# Patient Record
Sex: Male | Born: 1940 | Race: White | Hispanic: No | Marital: Married | Smoking: Never smoker
Health system: Southern US, Community
[De-identification: ages and names within clinical notes are randomized; demographics above are authoritative.]

## PROBLEM LIST (undated history)

## (undated) DIAGNOSIS — I255 Ischemic cardiomyopathy: Secondary | ICD-10-CM

## (undated) DIAGNOSIS — E785 Hyperlipidemia, unspecified: Secondary | ICD-10-CM

## (undated) DIAGNOSIS — I2510 Atherosclerotic heart disease of native coronary artery without angina pectoris: Secondary | ICD-10-CM

## (undated) DIAGNOSIS — J45909 Unspecified asthma, uncomplicated: Secondary | ICD-10-CM

## (undated) DIAGNOSIS — K869 Disease of pancreas, unspecified: Secondary | ICD-10-CM

## (undated) DIAGNOSIS — J449 Chronic obstructive pulmonary disease, unspecified: Secondary | ICD-10-CM

## (undated) DIAGNOSIS — C801 Malignant (primary) neoplasm, unspecified: Secondary | ICD-10-CM

## (undated) HISTORY — PX: OTHER SURGICAL HISTORY: SHX169

## (undated) HISTORY — DX: Ischemic cardiomyopathy: I25.5

## (undated) HISTORY — DX: Atherosclerotic heart disease of native coronary artery without angina pectoris: I25.10

## (undated) HISTORY — DX: Disease of pancreas, unspecified: K86.9

---

## 2000-11-21 ENCOUNTER — Encounter: Payer: Self-pay | Admitting: Emergency Medicine

## 2000-11-21 ENCOUNTER — Inpatient Hospital Stay (HOSPITAL_COMMUNITY): Admission: EM | Admit: 2000-11-21 | Discharge: 2000-11-23 | Payer: Self-pay | Admitting: Emergency Medicine

## 2000-11-27 ENCOUNTER — Inpatient Hospital Stay (HOSPITAL_COMMUNITY): Admission: EM | Admit: 2000-11-27 | Discharge: 2000-11-27 | Payer: Self-pay | Admitting: Emergency Medicine

## 2000-11-30 ENCOUNTER — Encounter: Admission: RE | Admit: 2000-11-30 | Discharge: 2000-11-30 | Payer: Self-pay | Admitting: Internal Medicine

## 2000-12-06 ENCOUNTER — Ambulatory Visit (HOSPITAL_COMMUNITY): Admission: RE | Admit: 2000-12-06 | Discharge: 2000-12-06 | Payer: Self-pay | Admitting: *Deleted

## 2001-01-12 ENCOUNTER — Encounter: Admission: RE | Admit: 2001-01-12 | Discharge: 2001-01-12 | Payer: Self-pay

## 2001-03-07 ENCOUNTER — Encounter: Admission: RE | Admit: 2001-03-07 | Discharge: 2001-03-07 | Payer: Self-pay | Admitting: Internal Medicine

## 2001-03-14 ENCOUNTER — Inpatient Hospital Stay (HOSPITAL_COMMUNITY): Admission: EM | Admit: 2001-03-14 | Discharge: 2001-03-16 | Payer: Self-pay | Admitting: Emergency Medicine

## 2001-03-19 ENCOUNTER — Ambulatory Visit (HOSPITAL_COMMUNITY): Admission: RE | Admit: 2001-03-19 | Discharge: 2001-03-19 | Payer: Self-pay | Admitting: Internal Medicine

## 2001-03-23 ENCOUNTER — Encounter: Admission: RE | Admit: 2001-03-23 | Discharge: 2001-03-23 | Payer: Self-pay | Admitting: Internal Medicine

## 2001-07-03 ENCOUNTER — Ambulatory Visit (HOSPITAL_COMMUNITY): Admission: RE | Admit: 2001-07-03 | Discharge: 2001-07-03 | Payer: Self-pay | Admitting: Pulmonary Disease

## 2001-07-03 ENCOUNTER — Encounter: Payer: Self-pay | Admitting: Pulmonary Disease

## 2001-12-03 ENCOUNTER — Encounter: Payer: Self-pay | Admitting: Emergency Medicine

## 2001-12-03 ENCOUNTER — Emergency Department (HOSPITAL_COMMUNITY): Admission: EM | Admit: 2001-12-03 | Discharge: 2001-12-03 | Payer: Self-pay | Admitting: Emergency Medicine

## 2002-01-29 ENCOUNTER — Ambulatory Visit (HOSPITAL_COMMUNITY): Admission: RE | Admit: 2002-01-29 | Discharge: 2002-01-29 | Payer: Self-pay | Admitting: Internal Medicine

## 2004-03-17 ENCOUNTER — Ambulatory Visit (HOSPITAL_COMMUNITY): Admission: RE | Admit: 2004-03-17 | Discharge: 2004-03-17 | Payer: Self-pay | Admitting: Dentistry

## 2015-11-02 ENCOUNTER — Other Ambulatory Visit: Payer: Self-pay | Admitting: Specialist

## 2015-11-02 DIAGNOSIS — M545 Low back pain: Secondary | ICD-10-CM

## 2015-11-09 ENCOUNTER — Ambulatory Visit
Admission: RE | Admit: 2015-11-09 | Discharge: 2015-11-09 | Disposition: A | Discharge status: Home / Self Care | Payer: Medicare Other | Source: Ambulatory Visit | Attending: Specialist | Admitting: Specialist

## 2015-11-09 DIAGNOSIS — M545 Low back pain: Secondary | ICD-10-CM

## 2015-12-03 ENCOUNTER — Other Ambulatory Visit: Payer: Self-pay | Admitting: Specialist

## 2015-12-03 DIAGNOSIS — M25511 Pain in right shoulder: Secondary | ICD-10-CM

## 2015-12-09 ENCOUNTER — Ambulatory Visit
Admission: RE | Admit: 2015-12-09 | Discharge: 2015-12-09 | Disposition: A | Discharge status: Home / Self Care | Payer: Medicare Other | Source: Ambulatory Visit | Attending: Specialist | Admitting: Specialist

## 2015-12-09 DIAGNOSIS — M25511 Pain in right shoulder: Secondary | ICD-10-CM

## 2016-03-07 ENCOUNTER — Emergency Department (HOSPITAL_COMMUNITY): Payer: Medicare Other

## 2016-03-07 ENCOUNTER — Encounter (HOSPITAL_COMMUNITY): Payer: Self-pay | Admitting: Emergency Medicine

## 2016-03-07 ENCOUNTER — Inpatient Hospital Stay (HOSPITAL_COMMUNITY)
Admission: EM | Admit: 2016-03-07 | Discharge: 2016-03-10 | DRG: 282 | Disposition: A | Discharge status: Home / Self Care | Payer: Medicare Other | Attending: Cardiovascular Disease | Admitting: Cardiovascular Disease

## 2016-03-07 ENCOUNTER — Encounter (HOSPITAL_COMMUNITY)
Admission: EM | Disposition: A | Discharge status: Home / Self Care | Payer: Self-pay | Source: Home / Self Care | Attending: Cardiovascular Disease

## 2016-03-07 DIAGNOSIS — K8689 Other specified diseases of pancreas: Secondary | ICD-10-CM

## 2016-03-07 DIAGNOSIS — K869 Disease of pancreas, unspecified: Secondary | ICD-10-CM | POA: Diagnosis present

## 2016-03-07 DIAGNOSIS — R739 Hyperglycemia, unspecified: Secondary | ICD-10-CM | POA: Diagnosis present

## 2016-03-07 DIAGNOSIS — R935 Abnormal findings on diagnostic imaging of other abdominal regions, including retroperitoneum: Secondary | ICD-10-CM | POA: Diagnosis present

## 2016-03-07 DIAGNOSIS — I2510 Atherosclerotic heart disease of native coronary artery without angina pectoris: Secondary | ICD-10-CM | POA: Diagnosis present

## 2016-03-07 DIAGNOSIS — E785 Hyperlipidemia, unspecified: Secondary | ICD-10-CM | POA: Diagnosis present

## 2016-03-07 DIAGNOSIS — J4520 Mild intermittent asthma, uncomplicated: Secondary | ICD-10-CM

## 2016-03-07 DIAGNOSIS — R079 Chest pain, unspecified: Secondary | ICD-10-CM

## 2016-03-07 DIAGNOSIS — I252 Old myocardial infarction: Secondary | ICD-10-CM | POA: Diagnosis not present

## 2016-03-07 DIAGNOSIS — R7989 Other specified abnormal findings of blood chemistry: Secondary | ICD-10-CM

## 2016-03-07 DIAGNOSIS — J45909 Unspecified asthma, uncomplicated: Secondary | ICD-10-CM | POA: Diagnosis present

## 2016-03-07 DIAGNOSIS — I25110 Atherosclerotic heart disease of native coronary artery with unstable angina pectoris: Secondary | ICD-10-CM | POA: Diagnosis not present

## 2016-03-07 DIAGNOSIS — J449 Chronic obstructive pulmonary disease, unspecified: Secondary | ICD-10-CM | POA: Diagnosis present

## 2016-03-07 DIAGNOSIS — I255 Ischemic cardiomyopathy: Secondary | ICD-10-CM | POA: Diagnosis present

## 2016-03-07 DIAGNOSIS — K862 Cyst of pancreas: Secondary | ICD-10-CM | POA: Diagnosis not present

## 2016-03-07 DIAGNOSIS — I214 Non-ST elevation (NSTEMI) myocardial infarction: Secondary | ICD-10-CM | POA: Diagnosis present

## 2016-03-07 DIAGNOSIS — K8020 Calculus of gallbladder without cholecystitis without obstruction: Secondary | ICD-10-CM

## 2016-03-07 DIAGNOSIS — R778 Other specified abnormalities of plasma proteins: Secondary | ICD-10-CM

## 2016-03-07 HISTORY — DX: Chronic obstructive pulmonary disease, unspecified: J44.9

## 2016-03-07 HISTORY — PX: CARDIAC CATHETERIZATION: SHX172

## 2016-03-07 HISTORY — DX: Unspecified asthma, uncomplicated: J45.909

## 2016-03-07 HISTORY — DX: Hyperlipidemia, unspecified: E78.5

## 2016-03-07 LAB — CBC WITH DIFFERENTIAL/PLATELET
Basophils Absolute: 0 10*3/uL (ref 0.0–0.1)
Basophils Relative: 0 %
Eosinophils Absolute: 0.3 10*3/uL (ref 0.0–0.7)
Eosinophils Relative: 4 %
HCT: 44.7 % (ref 39.0–52.0)
HEMOGLOBIN: 15.5 g/dL (ref 13.0–17.0)
LYMPHS ABS: 1.2 10*3/uL (ref 0.7–4.0)
LYMPHS PCT: 21 %
MCH: 29.6 pg (ref 26.0–34.0)
MCHC: 34.7 g/dL (ref 30.0–36.0)
MCV: 85.3 fL (ref 78.0–100.0)
Monocytes Absolute: 0.5 10*3/uL (ref 0.1–1.0)
Monocytes Relative: 9 %
NEUTROS PCT: 66 %
Neutro Abs: 3.9 10*3/uL (ref 1.7–7.7)
Platelets: 192 10*3/uL (ref 150–400)
RBC: 5.24 MIL/uL (ref 4.22–5.81)
RDW: 13.5 % (ref 11.5–15.5)
WBC: 5.9 10*3/uL (ref 4.0–10.5)

## 2016-03-07 LAB — ECHOCARDIOGRAM COMPLETE
Height: 70 in
Weight: 3552 oz

## 2016-03-07 LAB — COMPREHENSIVE METABOLIC PANEL
ALT: 14 U/L — AB (ref 17–63)
AST: 19 U/L (ref 15–41)
Albumin: 3.6 g/dL (ref 3.5–5.0)
Alkaline Phosphatase: 41 U/L (ref 38–126)
Anion gap: 8 (ref 5–15)
BUN: 12 mg/dL (ref 6–20)
CHLORIDE: 103 mmol/L (ref 101–111)
CO2: 27 mmol/L (ref 22–32)
CREATININE: 0.95 mg/dL (ref 0.61–1.24)
Calcium: 9.3 mg/dL (ref 8.9–10.3)
Glucose, Bld: 107 mg/dL — ABNORMAL HIGH (ref 65–99)
POTASSIUM: 4.2 mmol/L (ref 3.5–5.1)
SODIUM: 138 mmol/L (ref 135–145)
Total Bilirubin: 0.8 mg/dL (ref 0.3–1.2)
Total Protein: 6.8 g/dL (ref 6.5–8.1)

## 2016-03-07 LAB — URINALYSIS, ROUTINE W REFLEX MICROSCOPIC
BILIRUBIN URINE: NEGATIVE
Glucose, UA: NEGATIVE mg/dL
HGB URINE DIPSTICK: NEGATIVE
KETONES UR: NEGATIVE mg/dL
Leukocytes, UA: NEGATIVE
NITRITE: NEGATIVE
PROTEIN: NEGATIVE mg/dL
SPECIFIC GRAVITY, URINE: 1.019 (ref 1.005–1.030)
pH: 7 (ref 5.0–8.0)

## 2016-03-07 LAB — LIPASE, BLOOD: LIPASE: 39 U/L (ref 11–51)

## 2016-03-07 LAB — TSH: TSH: 1.988 u[IU]/mL (ref 0.350–4.500)

## 2016-03-07 LAB — I-STAT TROPONIN, ED: TROPONIN I, POC: 0.38 ng/mL — AB (ref 0.00–0.08)

## 2016-03-07 LAB — MRSA PCR SCREENING: MRSA by PCR: NEGATIVE

## 2016-03-07 LAB — TROPONIN I
TROPONIN I: 0.31 ng/mL — AB (ref <0.031)
TROPONIN I: 3.19 ng/mL — AB (ref <0.031)
Troponin I: 0.85 ng/mL (ref <0.031)
Troponin I: 4.46 ng/mL (ref <0.031)
Troponin I: 10.68 ng/mL (ref <0.031)

## 2016-03-07 LAB — CBG MONITORING, ED: Glucose-Capillary: 130 mg/dL — ABNORMAL HIGH (ref 65–99)

## 2016-03-07 LAB — D-DIMER, QUANTITATIVE (NOT AT ARMC): D DIMER QUANT: 0.47 ug{FEU}/mL (ref 0.00–0.50)

## 2016-03-07 LAB — PROTIME-INR
INR: 1.13 (ref 0.00–1.49)
Prothrombin Time: 14.7 seconds (ref 11.6–15.2)

## 2016-03-07 SURGERY — LEFT HEART CATH AND CORONARY ANGIOGRAPHY

## 2016-03-07 MED ORDER — ZOLPIDEM TARTRATE 5 MG PO TABS: 5.0000 mg | ORAL_TABLET | Freq: Every evening | ORAL | Status: DC | PRN

## 2016-03-07 MED ORDER — SODIUM CHLORIDE 0.9 % IV BOLUS (SEPSIS)
1000.0000 mL | Freq: Once | INTRAVENOUS | Status: AC
  Administered 2016-03-07: 1000 mL via INTRAVENOUS

## 2016-03-07 MED ORDER — SODIUM CHLORIDE 0.9% FLUSH: 3.0000 mL | INTRAVENOUS | Status: DC | PRN

## 2016-03-07 MED ORDER — SODIUM CHLORIDE 0.9 % WEIGHT BASED INFUSION: 1.0000 mL/kg/h | INTRAVENOUS | Status: AC

## 2016-03-07 MED ORDER — ACETAMINOPHEN 325 MG PO TABS
650.0000 mg | ORAL_TABLET | ORAL | Status: DC | PRN
  Administered 2016-03-07 – 2016-03-09 (×3): 650 mg via ORAL
  Filled 2016-03-07 (×3): qty 2

## 2016-03-07 MED ORDER — ISOSORBIDE MONONITRATE ER 30 MG PO TB24
15.0000 mg | ORAL_TABLET | Freq: Every day | ORAL | Status: DC
  Administered 2016-03-07 – 2016-03-10 (×4): 15 mg via ORAL
  Filled 2016-03-07 (×4): qty 1

## 2016-03-07 MED ORDER — SODIUM CHLORIDE 0.9 % WEIGHT BASED INFUSION: 1.0000 mL/kg/h | INTRAVENOUS | Status: DC

## 2016-03-07 MED ORDER — MORPHINE SULFATE (PF) 4 MG/ML IV SOLN
4.0000 mg | Freq: Once | INTRAVENOUS | Status: DC
  Filled 2016-03-07: qty 1

## 2016-03-07 MED ORDER — HEPARIN (PORCINE) IN NACL 2-0.9 UNIT/ML-% IJ SOLN
INTRAMUSCULAR | Status: AC
  Filled 2016-03-07: qty 1000

## 2016-03-07 MED ORDER — MOMETASONE FURO-FORMOTEROL FUM 200-5 MCG/ACT IN AERO
2.0000 | INHALATION_SPRAY | Freq: Two times a day (BID) | RESPIRATORY_TRACT | Status: DC
  Administered 2016-03-07 – 2016-03-09 (×5): 2 via RESPIRATORY_TRACT
  Filled 2016-03-07: qty 8.8

## 2016-03-07 MED ORDER — LIDOCAINE HCL (PF) 1 % IJ SOLN
INTRAMUSCULAR | Status: AC
  Filled 2016-03-07: qty 30

## 2016-03-07 MED ORDER — IOPAMIDOL (ISOVUE-370) INJECTION 76%
INTRAVENOUS | Status: DC | PRN
  Administered 2016-03-07: 75 mL via INTRA_ARTERIAL

## 2016-03-07 MED ORDER — ONDANSETRON HCL 4 MG/2ML IJ SOLN
4.0000 mg | Freq: Four times a day (QID) | INTRAMUSCULAR | Status: DC | PRN
  Administered 2016-03-07: 4 mg via INTRAVENOUS
  Filled 2016-03-07: qty 2

## 2016-03-07 MED ORDER — MIDAZOLAM HCL 2 MG/2ML IJ SOLN
INTRAMUSCULAR | Status: DC | PRN
  Administered 2016-03-07: 1 mg via INTRAVENOUS

## 2016-03-07 MED ORDER — LEVALBUTEROL HCL 0.63 MG/3ML IN NEBU: 0.6300 mg | INHALATION_SOLUTION | Freq: Four times a day (QID) | RESPIRATORY_TRACT | Status: DC | PRN

## 2016-03-07 MED ORDER — VERAPAMIL HCL 2.5 MG/ML IV SOLN
INTRAVENOUS | Status: AC
Start: 2016-03-07 — End: 2016-03-07
  Filled 2016-03-07: qty 2

## 2016-03-07 MED ORDER — HEPARIN SODIUM (PORCINE) 1000 UNIT/ML IJ SOLN
INTRAMUSCULAR | Status: DC | PRN
  Administered 2016-03-07: 5000 [IU] via INTRAVENOUS

## 2016-03-07 MED ORDER — VERAPAMIL HCL 2.5 MG/ML IV SOLN
INTRAVENOUS | Status: DC | PRN
  Administered 2016-03-07: 10 mL via INTRA_ARTERIAL

## 2016-03-07 MED ORDER — HEPARIN SODIUM (PORCINE) 1000 UNIT/ML IJ SOLN
INTRAMUSCULAR | Status: AC
  Filled 2016-03-07: qty 1

## 2016-03-07 MED ORDER — MIDAZOLAM HCL 2 MG/2ML IJ SOLN
INTRAMUSCULAR | Status: AC
  Filled 2016-03-07: qty 2

## 2016-03-07 MED ORDER — IPRATROPIUM-ALBUTEROL 0.5-2.5 (3) MG/3ML IN SOLN
3.0000 mL | Freq: Once | RESPIRATORY_TRACT | Status: AC
  Administered 2016-03-07: 3 mL via RESPIRATORY_TRACT
  Filled 2016-03-07: qty 3

## 2016-03-07 MED ORDER — TESTOSTERONE 10 MG/ACT (2%) TD GEL: 1.0000 "application " | Freq: Every evening | TRANSDERMAL | Status: DC

## 2016-03-07 MED ORDER — SODIUM CHLORIDE 0.9 % WEIGHT BASED INFUSION: 3.0000 mL/kg/h | INTRAVENOUS | Status: AC

## 2016-03-07 MED ORDER — FENTANYL CITRATE (PF) 100 MCG/2ML IJ SOLN
50.0000 ug | Freq: Once | INTRAMUSCULAR | Status: DC
  Filled 2016-03-07: qty 2

## 2016-03-07 MED ORDER — HEPARIN (PORCINE) IN NACL 100-0.45 UNIT/ML-% IJ SOLN
1250.0000 [IU]/h | INTRAMUSCULAR | Status: DC
  Administered 2016-03-07: 1250 [IU]/h via INTRAVENOUS
  Filled 2016-03-07: qty 250

## 2016-03-07 MED ORDER — SODIUM CHLORIDE 0.9 % IV SOLN: 250.0000 mL | INTRAVENOUS | Status: DC | PRN

## 2016-03-07 MED ORDER — SODIUM CHLORIDE 0.9% FLUSH
3.0000 mL | Freq: Two times a day (BID) | INTRAVENOUS | Status: DC
  Administered 2016-03-07 – 2016-03-10 (×5): 3 mL via INTRAVENOUS

## 2016-03-07 MED ORDER — HEPARIN BOLUS VIA INFUSION
4000.0000 [IU] | Freq: Once | INTRAVENOUS | Status: AC
  Administered 2016-03-07: 4000 [IU] via INTRAVENOUS
  Filled 2016-03-07: qty 4000

## 2016-03-07 MED ORDER — SODIUM CHLORIDE 0.9 % IV SOLN
INTRAVENOUS | Status: DC | PRN
  Administered 2016-03-07: 20 mL/h via INTRAVENOUS

## 2016-03-07 MED ORDER — LIDOCAINE HCL (PF) 1 % IJ SOLN
INTRAMUSCULAR | Status: DC | PRN
  Administered 2016-03-07: 2 mL

## 2016-03-07 MED ORDER — SODIUM CHLORIDE 0.9% FLUSH
3.0000 mL | Freq: Two times a day (BID) | INTRAVENOUS | Status: DC
  Administered 2016-03-08: 3 mL via INTRAVENOUS

## 2016-03-07 MED ORDER — HEPARIN (PORCINE) IN NACL 2-0.9 UNIT/ML-% IJ SOLN
INTRAMUSCULAR | Status: DC | PRN
  Administered 2016-03-07: 1000 mL

## 2016-03-07 MED ORDER — FENTANYL CITRATE (PF) 100 MCG/2ML IJ SOLN
INTRAMUSCULAR | Status: AC
  Filled 2016-03-07: qty 2

## 2016-03-07 MED ORDER — SODIUM CHLORIDE 0.9 % WEIGHT BASED INFUSION
1.0000 mL/kg/h | INTRAVENOUS | Status: DC
Start: 2016-03-08 — End: 2016-03-09

## 2016-03-07 MED ORDER — ENOXAPARIN SODIUM 40 MG/0.4ML ~~LOC~~ SOLN
40.0000 mg | SUBCUTANEOUS | Status: DC
  Administered 2016-03-08 – 2016-03-10 (×3): 40 mg via SUBCUTANEOUS
  Filled 2016-03-07 (×3): qty 0.4

## 2016-03-07 MED ORDER — PANTOPRAZOLE SODIUM 40 MG IV SOLR: 40.0000 mg | Freq: Once | INTRAVENOUS | Status: DC

## 2016-03-07 MED ORDER — ASPIRIN 81 MG PO CHEW
81.0000 mg | CHEWABLE_TABLET | ORAL | Status: DC
  Filled 2016-03-07: qty 1

## 2016-03-07 MED ORDER — ALPRAZOLAM 0.25 MG PO TABS: 0.2500 mg | ORAL_TABLET | Freq: Two times a day (BID) | ORAL | Status: DC | PRN

## 2016-03-07 MED ORDER — SODIUM CHLORIDE 0.9 % IV SOLN: INTRAVENOUS | Status: AC

## 2016-03-07 MED ORDER — ONDANSETRON HCL 4 MG/2ML IJ SOLN
4.0000 mg | Freq: Once | INTRAMUSCULAR | Status: AC
  Administered 2016-03-07: 4 mg via INTRAVENOUS
  Filled 2016-03-07: qty 2

## 2016-03-07 MED ORDER — NITROGLYCERIN 0.4 MG SL SUBL
0.4000 mg | SUBLINGUAL_TABLET | SUBLINGUAL | Status: DC | PRN
  Administered 2016-03-07: 0.4 mg via SUBLINGUAL
  Filled 2016-03-07: qty 1

## 2016-03-07 MED ORDER — ONDANSETRON HCL 4 MG/2ML IJ SOLN: 4.0000 mg | Freq: Once | INTRAMUSCULAR | Status: DC

## 2016-03-07 MED ORDER — ATORVASTATIN CALCIUM 80 MG PO TABS
80.0000 mg | ORAL_TABLET | Freq: Every day | ORAL | Status: DC
  Administered 2016-03-07 – 2016-03-09 (×3): 80 mg via ORAL
  Filled 2016-03-07 (×3): qty 1

## 2016-03-07 MED ORDER — GI COCKTAIL ~~LOC~~: 30.0000 mL | Freq: Once | ORAL | Status: DC

## 2016-03-07 MED ORDER — LORATADINE 10 MG PO TABS
10.0000 mg | ORAL_TABLET | Freq: Every day | ORAL | Status: DC
  Administered 2016-03-08 – 2016-03-10 (×3): 10 mg via ORAL
  Filled 2016-03-07 (×3): qty 1

## 2016-03-07 MED ORDER — FENTANYL CITRATE (PF) 100 MCG/2ML IJ SOLN
12.5000 ug | INTRAMUSCULAR | Status: DC | PRN
  Administered 2016-03-07: 25 ug via INTRAVENOUS
  Filled 2016-03-07: qty 2

## 2016-03-07 MED ORDER — FENTANYL CITRATE (PF) 100 MCG/2ML IJ SOLN
INTRAMUSCULAR | Status: DC | PRN
  Administered 2016-03-07: 25 ug via INTRAVENOUS

## 2016-03-07 MED ORDER — IOPAMIDOL (ISOVUE-370) INJECTION 76%
INTRAVENOUS | Status: AC
  Filled 2016-03-07: qty 100

## 2016-03-07 MED ORDER — METOPROLOL SUCCINATE ER 25 MG PO TB24
25.0000 mg | ORAL_TABLET | Freq: Every day | ORAL | Status: DC
  Administered 2016-03-07 – 2016-03-10 (×4): 25 mg via ORAL
  Filled 2016-03-07 (×4): qty 1

## 2016-03-07 SURGICAL SUPPLY — 11 items
CATH INFINITI 5 FR JL3.5 (CATHETERS) ×2 IMPLANT
CATH INFINITI 5FR ANG PIGTAIL (CATHETERS) ×2 IMPLANT
CATH INFINITI JR4 5F (CATHETERS) ×2 IMPLANT
DEVICE RAD COMP TR BAND LRG (VASCULAR PRODUCTS) ×2 IMPLANT
GLIDESHEATH SLEND SS 6F .021 (SHEATH) ×2 IMPLANT
KIT HEART LEFT (KITS) ×3 IMPLANT
PACK CARDIAC CATHETERIZATION (CUSTOM PROCEDURE TRAY) ×3 IMPLANT
SYR MEDRAD MARK V 150ML (SYRINGE) ×3 IMPLANT
TRANSDUCER W/STOPCOCK (MISCELLANEOUS) ×3 IMPLANT
TUBING CIL FLEX 10 FLL-RA (TUBING) ×3 IMPLANT
WIRE SAFE-T 1.5MM-J .035X260CM (WIRE) ×2 IMPLANT

## 2016-03-07 NOTE — Progress Notes (Signed)
  Echocardiogram 2D Echocardiogram has been performed.  Donata Clay 03/07/2016, 10:08 AM

## 2016-03-07 NOTE — H&P (Signed)
History and Physical   Patient ID: Frank Ayala MRN: [id number], DOB/AGE: [birth year] 75 y.o. Date of Encounter: 03/07/2016  Primary Physician: Dr Jackson Latino Primary Cardiologist: New, Dr Angelena Form  Chief Complaint:  Chest pain  HPI: Frank Ayala is a 75 y.o. male with a history of COPD, HLD, asthma Traveled to [place], returning recently   No recent SOB, no ischemic sx, activity level has been good. No SOB climbing stairs, no edema. Occasional wheezing, does not use inhaler. No bleeding issues.   Last pm, getting ready for bed, had onset of SSCP. No radiation, 7/10, associated SOB, no N&V, no diaphoresis. Not able to sleep. Thought was heartburn, GI rx no help.   When sx did not improve throughout the night, he came to the ER. In the ER, no ASA 2nd hx bronchospasm, SL NTG caused hypotension w/ SBP 70s and nausea, mild diaphoresis.  Now with 3-4/10 chest discomfort. Of note, abd [place] was abnl, results below.    Past Medical History  Diagnosis Date  . COPD (chronic obstructive pulmonary disease) ([place])   . Asthma   . Hyperlipidemia     Surgical History: History reviewed. No pertinent past surgical history.   I have reviewed the patient's current medications. Prior to Admission medications   Medication Sig Start Date End Date Taking? Authorizing Provider  albuterol (PROVENTIL HFA;VENTOLIN HFA) 108 (90 Base) MCG/ACT inhaler Inhale 1-2 puffs into the lungs every 6 (six) hours as needed for wheezing or shortness of breath.   Yes Historical Provider, MD  cetirizine (ZYRTEC) 10 MG tablet Take 10 mg by mouth daily as needed for allergies.   Yes Historical Provider, MD  Fluticasone-Salmeterol (ADVAIR) 250-50 MCG/DOSE AEPB Inhale 1 puff into the lungs 2 (two) times daily as needed (shortness of breath).   Yes Historical Provider, MD  loratadine (CLARITIN) 10 MG tablet Take 10 mg by mouth daily as needed for allergies.   Yes Historical Provider, MD  simvastatin (ZOCOR) 40 MG tablet Take  40 mg by mouth daily.   Yes Historical Provider, MD  Testosterone (FORTESTA) 10 MG/ACT (2%) GEL Place 1 application onto the skin every evening.   Yes Historical Provider, MD   Scheduled Meds: . ondansetron  4 mg Intravenous Once   Continuous Infusions: . heparin 1,250 Units/hr (03/07/16 0857)   PRN Meds:.fentaNYL (SUBLIMAZE) injection, nitroGLYCERIN  Allergies:  Allergies  Allergen Reactions  . Nsaids Shortness Of Breath    Social History   Social History  . Marital Status: Married    Spouse Name: N/A  . Number of Children: N/A  . Years of Education: N/A   Occupational History  . Retired Nature conservation officer and [employer]    Social History Main Topics  . Smoking status: Never Smoker   . Smokeless tobacco: Not on file  . Alcohol Use: No  . Drug Use: No  . Sexual Activity: Not on file   Other Topics Concern  . Not on file   Social History Narrative   Lives in [place] with wife    Family History  Problem Relation Age of Onset  . Cancer Father    Family Status  Relation Status Death Age  . Mother Deceased 86    no CAD  . Father Deceased 51    Cancer  . Sister Deceased 57    no CAD    Review of Systems:   Full 14-point review of systems otherwise negative except as noted above.  Physical Exam: Blood pressure 122/61, pulse  66, temperature 98.4 F (36.9 C), temperature source Oral, resp. rate 18, height 5\' 10"  (1.778 m), weight 222 lb (100.699 kg), SpO2 100 %. General: Well developed, well nourished,male in no acute distress. Head: Normocephalic, atraumatic, sclera non-icteric, no xanthomas, nares are without discharge. Dentition: good Neck: No carotid bruits. JVD not elevated. No thyromegally Lungs: Good expansion bilaterally. without rhonchi. Exp wheeze noted, few rales Heart: Regular rate and rhythm with S1 S2.  No S3 or S4.  No murmur, no rubs, or gallops appreciated. Abdomen: Soft, tender RUQ, non-distended with normoactive bowel sounds. No hepatomegaly. No rebound/guarding.  No obvious abdominal masses. Msk:  Strength and tone appear normal for age. No joint deformities or effusions, no spine or costo-vertebral angle tenderness. Extremities: No clubbing or cyanosis. No edema.  Distal pedal pulses are 2+ in 4 extrem Neuro: Alert and oriented X 3. Moves all extremities spontaneously. No focal deficits noted. Psych:  Responds to questions appropriately with a normal affect. Skin: No rashes or lesions noted  Labs:  Lab Results  Component Value Date   WBC 5.9 03/07/2016   HGB 15.5 03/07/2016   HCT 44.7 03/07/2016   MCV 85.3 03/07/2016   PLT 192 03/07/2016   No results for input(s): INR in the last 72 hours.   Recent Labs Lab 03/07/16 0624  NA 138  K 4.2  CL 103  CO2 27  BUN 12  CREATININE 0.95  CALCIUM 9.3  PROT 6.8  BILITOT 0.8  ALKPHOS 41  ALT 14*  AST 19  GLUCOSE 107*    Recent Labs  03/07/16 0637 03/07/16 0819  TROPONINI 0.31* 0.85*    Recent Labs  03/07/16 0633  TROPIPOC 0.38*   Lab Results  Component Value Date   DDIMER 0.47 03/07/2016    Radiology/Studies: Dg Chest 2 View 03/07/2016  CLINICAL DATA:  Right-sided chest pain and right upper quadrant abdominal pain since 11 p.m. last night. EXAM: CHEST  2 VIEW COMPARISON:  None. FINDINGS: The heart size and mediastinal contours are within normal limits. Linear atelectasis in the lung bases. No focal consolidation. Degenerative changes in the spine. IMPRESSION: Atelectasis in the lung bases.  No active consolidation. Electronically Signed   By: Lucienne Capers M.D.   On: 03/07/2016 06:47   US Abdomen Complete 03/07/2016  CLINICAL DATA:  Right upper quadrant pain for 1 day EXAM: ABDOMEN ULTRASOUND COMPLETE COMPARISON:  None. FINDINGS: Gallbladder: Within the gallbladder, there is an 8 mm echogenic focus which moves and shadows consistent with cholelithiasis. There is no demonstrable gallbladder wall thickening or pericholecystic fluid. No sonographic Murphy sign noted by sonographer.  Common bile duct: Diameter: 3 mm. There is no intrahepatic, common hepatic, or common bile duct dilatation. Liver: No focal lesion identified. Within normal limits in parenchymal echogenicity. IVC: No abnormality visualized. Pancreas: There is a hypoechoic lesion in the body of the pancreas measuring 1.9 x 1.1 x 2.4 cm. No other pancreatic lesion evident. Spleen: Size and appearance within normal limits. Right Kidney: Length: 11.1 cm. Echogenicity within normal limits. No mass or hydronephrosis visualized. Left Kidney: Length: 12.4 cm. Echogenicity within normal limits. No mass or hydronephrosis visualized. Abdominal aorta: No aneurysm visualized. Other findings: No demonstrable ascites. IMPRESSION: Cholelithiasis. No gallbladder wall thickening or pericholecystic fluid. Hypoechoic lesion in the body of the pancreas measuring 1.9 x 1.1 x 2.4 cm. This finding warrants either pre and post-contrast MR or CT of the pancreas to further evaluate. Study otherwise unremarkable. Electronically Signed   By: Lowella Grip III  M.D.   On: 03/07/2016 07:14   Echo: ordered  ECG: SR  ASSESSMENT AND PLAN:  Principal Problem:   NSTEMI (non-ST elevated myocardial infarction) ([place]) - admit, continue to cycle enzymes - ECG does not meet STEMI criteria - on heparin, no ASA 2nd bronchospasm - at this time, BP and HR too low for BB - admit stepdown - cath in am, sooner PRN - abdominal MRI prior to cath, would need to do BMS if abd mass likely to need surgery   Active Problems:   COPD (chronic obstructive pulmonary disease) ([facility]) - mild wheeze now - continue home rx, add Xopenex nebs    Asthma - see above    Hyperlipidemia - add high-dose statin - ck profile, LFTs are OK    Abnormal abdominal US - spoke w/ radiologist - MRI w/ and w/out is best, ordered - if abnl, further workup can likely be done as OP    Hyperglycemia - ck A1c  Signed, Lenoard Aden 03/07/2016 9:17 AM Beeper  [phone]    I have personally seen and examined this patient with Rosaria Ferries, PA-C. I agree with the assessment and plan as outlined above. He is admitted with chest pain, troponin is mildly elevated. His chest pain is mostly resolved. He did not respond well to NTG given in the ED. There was hypotension and nausea. Currently BP is stable. EKG with non-specific ST and T wave abnormalities but no injury current. Will admit to [facility] stepdown. Will continue IV heparin. Echo today. Will cycle troponin. He also has gallstones and pancreatic mass. The ED staff has ordered a MRI of the abdomen to better assess. Will likely need cardiac cath but would like to hold off until after we have a better idea of his pancreatic mass if possible. Will place him on the cath schedule for tomorrow. If his clinical status changes with recurrent chest pain or ischemic EKG changes, would have to plan cardiac cath today.   [name] 03/07/2016 11:18 AM

## 2016-03-07 NOTE — Progress Notes (Signed)
[place] for heparin Indication: chest pain/ACS  Allergies  Allergen Reactions  . Nsaids Shortness Of Breath    Patient Measurements: Height: 5\' 10"  (177.8 cm) Weight: 222 lb (100.699 kg) IBW/kg (Calculated) : 73 Heparin Dosing Weight: 94.1kg  Vital Signs: Temp: 98.4 F (36.9 C) (05/15 0539) Temp Source: Oral (05/15 0539) BP: 132/78 mmHg (05/15 0719) Pulse Rate: 61 (05/15 0719)  Labs:  Recent Labs  03/07/16 0624 03/07/16 0637  HGB 15.5  --   HCT 44.7  --   PLT 192  --   CREATININE 0.95  --   TROPONINI  --  0.31*    Estimated Creatinine Clearance: 81.1 mL/min (by C-G formula based on Cr of 0.95).   Medical History: Past Medical History  Diagnosis Date  . COPD (chronic obstructive pulmonary disease) ([place])   . Asthma   . Hyperlipidemia     Assessment: 74 yom w/ CP. Pharmacy consulted to dose heparin for ACS. No AC noted pta. CBC wnl, no bleed documented.  Goal of Therapy:  Heparin level 0.3-0.7 units/ml Monitor platelets by anticoagulation protocol: Yes   Plan:  Heparin 4000 unit bolus Start heparin at 1250 units/h 8h HL Daily HL/CBC Mon s/sx bleeding    Elicia Lamp, PharmD, [facility] Clinical Pharmacist Pager [phone] 03/07/2016 8:31 AM

## 2016-03-07 NOTE — ED Notes (Signed)
Cardiology at bedside.

## 2016-03-07 NOTE — ED Notes (Signed)
Pt arrives by POV with c/o right chest pain that started last night. Describes as a deep, sharp pain with some pressure. Pt traveled to [place] recently, but denies n/v/d or flu symptoms.

## 2016-03-07 NOTE — H&P (View-Only) (Signed)
Pt has had recurrence of chest pressure. Troponin is rising, now up to 3. I think urgent cardiac cath is indicated. I have reviewed this with the patient and his wife. I have added him onto the cath schedule for next case. Will hold off on MRI until tomorrow to assess the pancreatic mass seen on u/s this am. His NSTEMI will needed to be addressed urgently. Consider bare metal stent if needed given the possibility of a pancreatic mass.   [name] 03/07/2016 2:31 PM

## 2016-03-07 NOTE — ED Provider Notes (Addendum)
TIME SEEN: 6:00 AM  CHIEF COMPLAINT: Right-sided chest pain, upper abdominal pain  HPI: Pt is a 75 y.o. male with history of hyperlipidemia, asthma who presents to the emergency department with right-sided chest pain that started at 11:30 PM last night. Describes it as a constant pain that has not resolved at all. It is a sharp, pressure-like pain. He states it helps to put pressure on it with his hands. No aggravating factors. It is not exertional, pleuritic or related to food. States he did not come in earlier because he thought this could be heartburn and tried Tums without relief. Denies shortness of breath, nausea vomiting, diaphoresis.  States he did have some dizziness with standing up quickly. Has never had similar symptoms. No history of PE or DVT. No lower extremity swelling or pain. No fever or cough. Has had some intermittent wheezing which he reports is chronic for him. States he is due for his morning albuterol treatment. He has never been a smoker. No history of stress test or cardiac catheterization.  No history of NSAID use. Rarely drinks alcohol. No history of abdominal surgery.  PCP - [facility]  ROS: See HPI Constitutional: no fever  Eyes: no drainage  ENT: no runny nose   Cardiovascular:   chest pain  Resp: SOB  GI: no vomiting GU: no dysuria Integumentary: no rash  Allergy: no hives  Musculoskeletal: no leg swelling  Neurological: no slurred speech ROS otherwise negative  PAST MEDICAL HISTORY/PAST SURGICAL HISTORY:  No past medical history on file.  MEDICATIONS:  Prior to Admission medications   Not on File    ALLERGIES:  Allergies not on file  SOCIAL HISTORY:  Social History  Substance Use Topics  . Smoking status: Not on file  . Smokeless tobacco: Not on file  . Alcohol Use: Not on file    FAMILY HISTORY: No family history on file.  EXAM: BP 143/77 mmHg  Pulse 65  Temp(Src) 98.4 F (36.9 C) (Oral)  Resp 17  Ht 5\' 10"  (1.778 m)  Wt 222 lb  (100.699 kg)  BMI 31.85 kg/m2  SpO2 95% CONSTITUTIONAL: Alert and oriented and responds appropriately to questions. Well-appearing; well-nourished HEAD: Normocephalic EYES: Conjunctivae clear, PERRL ENT: normal nose; no rhinorrhea; moist mucous membranes NECK: Supple, no meningismus, no LAD  CARD: RRR; S1 and S2 appreciated; no murmurs, no clicks, no rubs, no gallops CHEST:  No tenderness palpation over the right chest wall but patient does report that pressure to this area helps relieve his pain, no crepitus, ecchymosis or deformity, no flail chest RESP: Normal chest excursion without splinting or tachypnea; breath sounds clear and equal bilaterally; mild scattered expiratory wheezes bilaterally, no rhonchi, no rales, no hypoxia or respiratory distress, speaking full sentences ABD/GI: Normal bowel sounds; non-distended; soft, tender to palpation in the right upper quadrant epigastric region, negative Murphy sign, no tenderness at McBurney's point, no rebound, no guarding, no peritoneal signs BACK:  The back appears normal and is non-tender to palpation, there is no CVA tenderness EXT: Normal ROM in all joints; non-tender to palpation; no edema; normal capillary refill; no cyanosis, no calf tenderness or swelling    SKIN: Normal color for age and race; warm; no rash NEURO: Moves all extremities equally, sensation to light touch intact diffusely, cranial nerves II through XII intact PSYCH: The patient's mood and manner are appropriate. Grooming and personal hygiene are appropriate.  MEDICAL DECISION MAKING: 6:00 AM  Patient here with very atypical chest pain. It is right-sided without  other associated symptoms. EKG shows small amount of elevation in one anteriorly than a small amount of ST depression in the lateral leads but is similar compared to prior and does not meet STEMI criteria.  Because of this abnormality, we'll repeat his EKG. He is tender to palpation in the right upper quadrant  epigastric region. Discussed with patient that this could be musculoskeletal in nature, cholelithiasis, cholecystitis, pancreatitis, gastritis, GERD. Will obtain cardiac labs as well as abdominal labs. Will obtain a right upper quad ultrasound, chest x-ray. He does have some wheezing on exam. Will give DuoNeb treatment.    ED PROGRESS: 6:30 AM  Patient taken over to ultrasound suite. i-stat troponin has come back positive at 0.38. Nurse to go over to ultrasound suite to obtain second EKG.  Will add on regular troponin.    6:50 AM  Pt's repeat EKG shows minimal ST elevation in V3 which is isolated and does not meet STEMI criteria. No reciprocal changes. Patient is still hemodynamically stable.   7:20 AM  Pt's repeat troponin is still elevated at 0.31 in the setting of normal kidney function. D-dimer is negative. LFTs, lipase normal. Chest x-ray clear. Ultrasound shows cholelithiasis without gallbladder wall thickening or pericholecystic fluid. No signs of acute cholecystitis. There is also a hypoechoic lesion in the body of the pancreas measuring 1.9 x 1.1 x 2.4 cm. Patient will need further imaging for further evaluation of this mass. I do not feel this needs to be done emergently. We'll discuss with cardiology on call given he does have a positive troponin. Patient refuses aspirins as he states that NSAIDs make him have asthma exacerbations.   8:00 AM  D/w cardiology consult service, Trish. They will send cardiology team to consult patient. Discussed with on coming EDP as well. Patient will need admission. He is comfortable with this plan.   EKG Interpretation  Date/Time:  Monday Mar 07 2016 05:37:34 EDT Ventricular Rate:  65 PR Interval:  173 QRS Duration: 98 QT Interval:  402 QTC Calculation: 418 R Axis:   50 Text Interpretation:  Sinus rhythm Atrial premature complex Minimal ST depression, lateral leads ST elevation, consider anterior injury No significant change since last tracing Confirmed  by WARD,  DO, KRISTEN [phone]) on 03/07/2016 5:43:47 AM       EKG Interpretation  Date/Time:  Monday Mar 07 2016 06:49:44 EDT Ventricular Rate:  69 PR Interval:  168 QRS Duration: 92 QT Interval:  410 QTC Calculation: 439 R Axis:   58 Text Interpretation:  Normal sinus rhythm with sinus arrhythmia Normal ECG No significant change since last tracing Confirmed by WARD,  DO, KRISTEN [id number]) on 03/07/2016 6:57:45 AM        Dante, DO 03/07/16 0758    8:05 AM  Pt Received one nitroglycerin tablet for his chest pain. He refused IV narcotics. Patient became diaphoretic, pale, gray appearing. He did report some improvement in his right-sided chest pain that he still describes as a dull ache. Patient was hypotensive with systolic blood pressure in the 70s, heart rate in the 40s. Given IV fluids, Zofran. Repeat EKG shows change in the morphology and ST segments in the anterior leads concerning for ischemia, ST depression in lateral leads. Given his positive troponin and EKG changes, will discuss with interventional last on call. Patient again reports he cannot take aspirin because of his history of NSAID induced bronchospasm.  Concern for worsening ischemia versus adverse reaction from nitroglycerin.  Will start heparin.   8:15  AM  Pt's HR in upper 50s and BP 107/69.  His color has improved And he reports feeling better. Blood glucose is 130.  Dr. [place] on call for cards in an emergent procedure.  Will have another cardiologist call back and review EKG.     8:20 AM  Dr. Julianne Handler and Suanne Marker PA with cardiology both at bedside. They have reviewed patient's EKGs and agree that this does not meet STEMI criteria. They agree with starting heparin and will admit for serial troponins and likely perform cardiac catheterization non-emergently. Patient still describes his pain as a dull ache which has not gotten worse or better during his stay in the emergency department. Cardiology would like to  further characterize his pancreatic mass. We'll discuss with radiology further recommendations on type of imaging. Patient and wife are comfortable with this plan.   8:45 AM  D/w Dr. Malachy Moan With radiology. They recommended MRI of patient's abdomen pancreatic protocol but states that this can be done as an outpatient test. Discussed this with cardiologist who will discuss with radiologist for appropriate test to be ordered.   EKG Interpretation  Date/Time:  Monday Mar 07 2016 08:02:12 EDT Ventricular Rate:  46 PR Interval:  156 QRS Duration: 91 QT Interval:  445 QTC Calculation: 389 R Axis:   24 Text Interpretation:  Sinus bradycardia Left atrial enlargement Low voltage, extremity and precordial leads Anteroseptal infarct, old ST depr, consider ischemia, anterolateral lds Tall T, consider metabolic/ischemic abnrm Baseline wander in lead(s) V2 V3 V4 V5 V6 Partial missing lead(s): V2 V3 V4 V5 V6 Confirmed by WARD,  DO, KRISTEN [id number]) on 03/07/2016 8:22:58 AM          CRITICAL CARE Performed by: Nyra Jabs   Total critical care time: 60 minutes  Critical care time was exclusive of separately billable procedures and treating other patients.  Critical care was necessary to treat or prevent imminent or life-threatening deterioration.  Critical care was time spent personally by me on the following activities: development of treatment plan with patient and/or surrogate as well as nursing, discussions with consultants, evaluation of patient's response to treatment, examination of patient, obtaining history from patient or surrogate, ordering and performing treatments and interventions, ordering and review of laboratory studies, ordering and review of radiographic studies, pulse oximetry and re-evaluation of patient's condition.   [place], DO 03/07/16 [phone]

## 2016-03-07 NOTE — Progress Notes (Signed)
Pt has had recurrence of chest pressure. Troponin is rising, now up to 3. I think urgent cardiac cath is indicated. I have reviewed this with the patient and his wife. I have added him onto the cath schedule for next case. Will hold off on MRI until tomorrow to assess the pancreatic mass seen on u/s this am. His NSTEMI will needed to be addressed urgently. Consider bare metal stent if needed given the possibility of a pancreatic mass.   [name] 03/07/2016 2:31 PM

## 2016-03-07 NOTE — Interval H&P Note (Signed)
History and Physical Interval Note:  03/07/2016 3:29 PM  Frank Ayala  has presented today for surgery, with the diagnosis of elevated trop  The various methods of treatment have been discussed with the patient and family. After consideration of risks, benefits and other options for treatment, the patient has consented to  Procedure(s): Left Heart Cath and Coronary Angiography (N/A) as a surgical intervention .  The patient's history has been reviewed, patient examined, no change in status, stable for surgery.  I have reviewed the patient's chart and labs.  Questions were answered to the patient's satisfaction.   Cath Lab Visit (complete for each Cath Lab visit)  Clinical Evaluation Leading to the Procedure:   ACS: Yes.    Non-ACS:    Anginal Classification: CCS IV  Anti-ischemic medical therapy: No Therapy  Non-Invasive Test Results: No non-invasive testing performed  Prior CABG: No previous CABG        [name] [facility] 03/07/2016 3:29 PM

## 2016-03-07 NOTE — ED Notes (Signed)
Attempted report 

## 2016-03-07 NOTE — ED Notes (Signed)
ECHO at bedside.

## 2016-03-08 ENCOUNTER — Encounter (HOSPITAL_COMMUNITY): Payer: Self-pay | Admitting: Cardiology

## 2016-03-08 ENCOUNTER — Inpatient Hospital Stay (HOSPITAL_COMMUNITY): Payer: Medicare Other

## 2016-03-08 DIAGNOSIS — I25110 Atherosclerotic heart disease of native coronary artery with unstable angina pectoris: Secondary | ICD-10-CM

## 2016-03-08 DIAGNOSIS — K869 Disease of pancreas, unspecified: Secondary | ICD-10-CM

## 2016-03-08 DIAGNOSIS — I255 Ischemic cardiomyopathy: Secondary | ICD-10-CM

## 2016-03-08 LAB — HEMOGLOBIN A1C
Hgb A1c MFr Bld: 6 % — ABNORMAL HIGH (ref 4.8–5.6)
Mean Plasma Glucose: 126 mg/dL

## 2016-03-08 LAB — CBC
HEMATOCRIT: 41.1 % (ref 39.0–52.0)
HEMOGLOBIN: 14.4 g/dL (ref 13.0–17.0)
MCH: 30.1 pg (ref 26.0–34.0)
MCHC: 35 g/dL (ref 30.0–36.0)
MCV: 85.8 fL (ref 78.0–100.0)
Platelets: 241 10*3/uL (ref 150–400)
RBC: 4.79 MIL/uL (ref 4.22–5.81)
RDW: 13.7 % (ref 11.5–15.5)
WBC: 9.5 10*3/uL (ref 4.0–10.5)

## 2016-03-08 LAB — BASIC METABOLIC PANEL
Anion gap: 10 (ref 5–15)
BUN: 10 mg/dL (ref 6–20)
CHLORIDE: 102 mmol/L (ref 101–111)
CO2: 26 mmol/L (ref 22–32)
CREATININE: 0.95 mg/dL (ref 0.61–1.24)
Calcium: 8.7 mg/dL — ABNORMAL LOW (ref 8.9–10.3)
GFR calc Af Amer: >60 mL/min (ref >60)
GFR calc non Af Amer: >60 mL/min (ref >60)
GLUCOSE: 110 mg/dL — AB (ref 65–99)
POTASSIUM: 4.6 mmol/L (ref 3.5–5.1)
SODIUM: 138 mmol/L (ref 135–145)

## 2016-03-08 LAB — LIPID PANEL
CHOL/HDL RATIO: 7.3 ratio
Cholesterol: 182 mg/dL (ref 0–200)
HDL: 25 mg/dL — ABNORMAL LOW (ref >40)
LDL CALC: 132 mg/dL — AB (ref 0–99)
Triglycerides: 127 mg/dL (ref <150)
VLDL: 25 mg/dL (ref 0–40)

## 2016-03-08 LAB — GLUCOSE, CAPILLARY: Glucose-Capillary: 107 mg/dL — ABNORMAL HIGH (ref 65–99)

## 2016-03-08 MED ORDER — CLOPIDOGREL BISULFATE 300 MG PO TABS
600.0000 mg | ORAL_TABLET | Freq: Once | ORAL | Status: AC
  Administered 2016-03-08: 600 mg via ORAL
  Filled 2016-03-08: qty 2

## 2016-03-08 MED ORDER — GADOBENATE DIMEGLUMINE 529 MG/ML IV SOLN
20.0000 mL | Freq: Once | INTRAVENOUS | Status: AC
  Administered 2016-03-08: 19 mL via INTRAVENOUS

## 2016-03-08 MED ORDER — CLOPIDOGREL BISULFATE 75 MG PO TABS
75.0000 mg | ORAL_TABLET | Freq: Every day | ORAL | Status: DC
  Administered 2016-03-09 – 2016-03-10 (×2): 75 mg via ORAL
  Filled 2016-03-08 (×3): qty 1

## 2016-03-08 NOTE — Progress Notes (Signed)
Spoke w pt and family. Pt not sure of dc needs at present. Explained role of case Freight forwarder. Will cont to follow and assist as pt progresses.

## 2016-03-08 NOTE — Progress Notes (Addendum)
SUBJECTIVE: No chest pain this am.   Tele: NSR    BP 114/64 mmHg  Pulse 73  Temp(Src) 98.9 F (37.2 C) (Oral)  Resp 16  Ht 5\' 10"  (1.778 m)  Wt 219 lb 1.6 oz (99.383 kg)  BMI 31.44 kg/m2  SpO2 93%  Intake/Output Summary (Last 24 hours) at 03/08/16 [id number] Last data filed at 03/08/16 0600  Gross per 24 hour  Intake 903.85 ml  Output    700 ml  Net 203.85 ml    PHYSICAL EXAM General: Well developed, well nourished, in no acute distress. Alert and oriented x 3.  Psych:  Good affect, responds appropriately Neck: No JVD. No masses noted.  Lungs: Clear bilaterally with no wheezes or rhonci noted.  Heart: RRR with no murmurs noted. Abdomen: Bowel sounds are present. Soft, non-tender.  Extremities: No lower extremity edema.   LABS: Basic Metabolic Panel:  Recent Labs  03/07/16 0624 03/08/16 0358  NA 138 138  K 4.2 4.6  CL 103 102  CO2 27 26  GLUCOSE 107* 110*  BUN 12 10  CREATININE 0.95 0.95  CALCIUM 9.3 8.7*   CBC:  Recent Labs  03/07/16 0624 03/08/16 0358  WBC 5.9 9.5  NEUTROABS 3.9  --   HGB 15.5 14.4  HCT 44.7 41.1  MCV 85.3 85.8  PLT 192 241   Cardiac Enzymes:  Recent Labs  03/07/16 1134 03/07/16 1916 03/07/16 2254  TROPONINI 3.19* 4.46* 10.68*   Fasting Lipid Panel:  Recent Labs  03/08/16 0358  CHOL 182  HDL 25*  LDLCALC 132*  TRIG 127  CHOLHDL 7.3    Current Meds: . aspirin  81 mg Oral Pre-Cath  . atorvastatin  80 mg Oral q1800  . enoxaparin (LOVENOX) injection  40 mg Subcutaneous Q24H  . isosorbide mononitrate  15 mg Oral Daily  . loratadine  10 mg Oral Daily  . metoprolol succinate  25 mg Oral Daily  . mometasone-formoterol  2 puff Inhalation BID  . ondansetron  4 mg Intravenous Once  . sodium chloride flush  3 mL Intravenous Q12H  . sodium chloride flush  3 mL Intravenous Q12H  . sodium chloride flush  3 mL Intravenous Q12H  . sodium chloride flush  3 mL Intravenous Q12H  . sodium chloride flush  3 mL Intravenous  Q12H  . Testosterone  1 application Transdermal QPM   Echo 03/07/16: Left ventricle: The cavity size was normal. Systolic function was  mildly to moderately reduced. The estimated ejection fraction was  in the range of 40% to 45%. Wall motion was normal; there were no  regional wall motion abnormalities. Doppler parameters are  consistent with abnormal left ventricular relaxation (grade 1  diastolic dysfunction). There was no evidence of elevated  ventricular filling pressure by Doppler parameters. - Aortic valve: Trileaflet; mildly thickened, mildly calcified  leaflets. There was no regurgitation. - Aortic root: The aortic root was normal in size. - Mitral valve: Calcified annulus. There was no regurgitation. - Left atrium: The atrium was mildly dilated. - Right ventricle: Systolic function was normal. - Tricuspid valve: There was trivial regurgitation. - Pulmonary arteries: Systolic pressure was within the normal  range. - Inferior vena cava: The vessel was normal in size. - Pericardium, extracardiac: There was no pericardial effusion.  Impressions:  - Wall motion abnormalities suspicious for hibernation or infarct  in the LAD terrotory.  ASSESSMENT AND PLAN:  1. CAD/NSTEMI/Ischemic cardiomyopathy: Pt admitted with chest pain, elevated troponin on 03/07/16. Cardiac  cath per Dr. Martinique with occlusion of the mid LAD with filling of distal vessel from collaterals. A small PDA branch was also occluded and filled from collaterals. The occlusion was felt to have been present for more than 12 hours given his chest pain. No PCI was performed. Echo with LVEF=40-45%. Anterior wall motion abnormality. Plan medical management of CAD for now. Will continue ASA, statin, Imdur, beta blocker. Will start Plavix today. Will add Ace-inh as BP tolerates.   2. Pancreatic mass: Plan MRI today to better characterize.   [name]  5/16/20179:22 AM

## 2016-03-08 NOTE — Progress Notes (Addendum)
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 Will ask GI to see him to discuss need for EUS for sampling/biopsy. He is on Plavix so this would have to be held for a biopsy.   He has no chest pain this afternoon. Will continue medical management of CAD.   Transfer to telemetry unit.   [name] 03/08/2016 2:24 PM   Addendum: Discussed case with Dr. Watt Climes from [facility] over the phone. I have passed along recommendations from the GI team to the patient and his wife. Since he will not be able to undergo EUS this week with recent MI, will arrange an appointment for him to see Dr. Paulita Fujita in the office on 03/14/16 [facility]) at 3:30 pm.   [name] 03/08/2016 4:13 PM

## 2016-03-08 NOTE — Progress Notes (Signed)
CARDIAC REHAB PHASE I   PRE:  Rate/Rhythm: 68 SR  BP:  Supine: 115/676  Sitting:   Standing:    SaO2: 95%RA  MODE:  Ambulation: 350 ft   POST:  Rate/Rhythm: 104 ST, 66 SR at rest   BP:  Supine:   Sitting: 128/72  Standing:    SaO2: 99%RA 1150-1212 Pt waiting for MRI results. Pt walked 350 ft on RA with asst x 1 with steady gait. No CP. Tolerated well. Left MI booklet and diet sheet for pt and wife. Will educate at later time as pt concerned with results at this time.   Graylon Good, RN BSN  03/08/2016 12:07 PM

## 2016-03-09 LAB — BASIC METABOLIC PANEL
ANION GAP: 11 (ref 5–15)
BUN: 10 mg/dL (ref 6–20)
CALCIUM: 8.9 mg/dL (ref 8.9–10.3)
CHLORIDE: 101 mmol/L (ref 101–111)
CO2: 27 mmol/L (ref 22–32)
Creatinine, Ser: 0.88 mg/dL (ref 0.61–1.24)
GFR calc non Af Amer: >60 mL/min (ref >60)
GLUCOSE: 101 mg/dL — AB (ref 65–99)
Potassium: 3.8 mmol/L (ref 3.5–5.1)
Sodium: 139 mmol/L (ref 135–145)

## 2016-03-09 LAB — CBC
HCT: 42.1 % (ref 39.0–52.0)
Hemoglobin: 14.6 g/dL (ref 13.0–17.0)
MCH: 29.4 pg (ref 26.0–34.0)
MCHC: 34.7 g/dL (ref 30.0–36.0)
MCV: 84.7 fL (ref 78.0–100.0)
PLATELETS: 182 10*3/uL (ref 150–400)
RBC: 4.97 MIL/uL (ref 4.22–5.81)
RDW: 13.6 % (ref 11.5–15.5)
WBC: 8.7 10*3/uL (ref 4.0–10.5)

## 2016-03-09 LAB — GLUCOSE, CAPILLARY
GLUCOSE-CAPILLARY: 74 mg/dL (ref 65–99)
Glucose-Capillary: 96 mg/dL (ref 65–99)

## 2016-03-09 NOTE — Progress Notes (Signed)
Patient lying in bed, no needs at this time. Call light within reach. 

## 2016-03-09 NOTE — Progress Notes (Signed)
SUBJECTIVE: No chest pain or SOB  Tele: sinus  BP 117/53 mmHg  Pulse 66  Temp(Src) 98.3 F (36.8 C) (Oral)  Resp 17  Ht 5\' 10"  (1.778 m)  Wt 214 lb 8 oz (97.297 kg)  BMI 30.78 kg/m2  SpO2 93%  Intake/Output Summary (Last 24 hours) at 03/09/16 0717 Last data filed at 03/09/16 0700  Gross per 24 hour  Intake    120 ml  Output   3000 ml  Net  -2880 ml    PHYSICAL EXAM General: Well developed, well nourished, in no acute distress. Alert and oriented x 3.  Psych:  Good affect, responds appropriately Neck: No JVD. No masses noted.  Lungs: Clear bilaterally with no wheezes or rhonci noted.  Heart: RRR with no murmurs noted. Abdomen: Bowel sounds are present. Soft, non-tender.  Extremities: No lower extremity edema.   LABS: Basic Metabolic Panel:  Recent Labs  03/08/16 0358 03/09/16 0340  NA 138 139  K 4.6 3.8  CL 102 101  CO2 26 27  GLUCOSE 110* 101*  BUN 10 10  CREATININE 0.95 0.88  CALCIUM 8.7* 8.9   CBC:  Recent Labs  03/07/16 0624 03/08/16 0358 03/09/16 0340  WBC 5.9 9.5 8.7  NEUTROABS 3.9  --   --   HGB 15.5 14.4 14.6  HCT 44.7 41.1 42.1  MCV 85.3 85.8 84.7  PLT 192 241 182   Cardiac Enzymes:  Recent Labs  03/07/16 1134 03/07/16 1916 03/07/16 2254  TROPONINI 3.19* 4.46* 10.68*   Fasting Lipid Panel:  Recent Labs  03/08/16 0358  CHOL 182  HDL 25*  LDLCALC 132*  TRIG 127  CHOLHDL 7.3    Current Meds: . atorvastatin  80 mg Oral q1800  . clopidogrel  75 mg Oral Daily  . enoxaparin (LOVENOX) injection  40 mg Subcutaneous Q24H  . isosorbide mononitrate  15 mg Oral Daily  . loratadine  10 mg Oral Daily  . metoprolol succinate  25 mg Oral Daily  . mometasone-formoterol  2 puff Inhalation BID  . ondansetron  4 mg Intravenous Once  . sodium chloride flush  3 mL Intravenous Q12H  . sodium chloride flush  3 mL Intravenous Q12H  . sodium chloride flush  3 mL Intravenous Q12H  . sodium chloride flush  3 mL Intravenous Q12H  .  sodium chloride flush  3 mL Intravenous Q12H  . Testosterone  1 application Transdermal QPM   Echo 03/07/16: Left ventricle: The cavity size was normal. Systolic function was  mildly to moderately reduced. The estimated ejection fraction was  in the range of 40% to 45%. Wall motion was normal; there were no  regional wall motion abnormalities. Doppler parameters are  consistent with abnormal left ventricular relaxation (grade 1  diastolic dysfunction). There was no evidence of elevated  ventricular filling pressure by Doppler parameters. - Aortic valve: Trileaflet; mildly thickened, mildly calcified  leaflets. There was no regurgitation. - Aortic root: The aortic root was normal in size. - Mitral valve: Calcified annulus. There was no regurgitation. - Left atrium: The atrium was mildly dilated. - Right ventricle: Systolic function was normal. - Tricuspid valve: There was trivial regurgitation. - Pulmonary arteries: Systolic pressure was within the normal  range. - Inferior vena cava: The vessel was normal in size. - Pericardium, extracardiac: There was no pericardial effusion.  Impressions:  - Wall motion abnormalities suspicious for hibernation or infarct  in the LAD terrotory.  ASSESSMENT AND PLAN:  1. CAD/NSTEMI/Ischemic cardiomyopathy:  Pt admitted with chest pain, elevated troponin on 03/07/16. Cardiac cath per Dr. Martinique with occlusion of the mid LAD with filling of distal vessel from collaterals. A small PDA branch was also occluded and filled from collaterals. The occlusion was felt to have been present for more than 12 hours given his chest pain. No PCI was performed. Echo with LVEF=40-45%. Anterior wall motion abnormality. Plan medical management of CAD for now. Will continue Plavix, statin, Imdur, beta blocker.  (of note, he reports an intolerance/allergy to NSAIDS). Will add Ace-inh as BP tolerates.   2. Pancreatic mass: MRI on 03/08/16 with cystic lesion, possible  IPMN vs benign cyst. He will need EUS in several weeks to better characterize with sampling. I consulted [facility] yesterday and they have arranged outpatient follow up with Dr. Paulita Fujita on 03/14/16 at 3:30pm. I have passed along the information obtained from Dr. Watt Climes yesterday.   Orders for transfer to telemetry placed yesterday afternoon. Transfer to tele when bed available. Likely d/c home 03/10/16.   [name]  5/17/20177:17 AM

## 2016-03-09 NOTE — Progress Notes (Signed)
CARDIAC REHAB PHASE I   PRE:  Rate/Rhythm: up independently in hall       MODE:  Ambulation: 1050 ft   POST:  Rate/Rhythm: 105 ST  BP:  Supine:   Sitting: 116/66  Standing:    SaO2:  1000-1100 Pt up walking independently in hall. Walked with him part of walk. Tolerated well. MI education completed with pt. Pt stated he did not tolerate NTG in ER so instructed to call 911 with CP. Discussed CRP 2 and referring to [place]. Since A1C at 6 instructed pt to watch carbs along with heart healthy diet.    Graylon Good, RN BSN  03/09/2016 10:53 AM

## 2016-03-10 DIAGNOSIS — K862 Cyst of pancreas: Secondary | ICD-10-CM

## 2016-03-10 LAB — BASIC METABOLIC PANEL
ANION GAP: 10 (ref 5–15)
BUN: 12 mg/dL (ref 6–20)
CHLORIDE: 101 mmol/L (ref 101–111)
CO2: 27 mmol/L (ref 22–32)
Calcium: 8.6 mg/dL — ABNORMAL LOW (ref 8.9–10.3)
Creatinine, Ser: 0.89 mg/dL (ref 0.61–1.24)
GFR calc Af Amer: >60 mL/min (ref >60)
Glucose, Bld: 102 mg/dL — ABNORMAL HIGH (ref 65–99)
POTASSIUM: 3.5 mmol/L (ref 3.5–5.1)
SODIUM: 138 mmol/L (ref 135–145)

## 2016-03-10 LAB — CBC
HCT: 41.9 % (ref 39.0–52.0)
HEMOGLOBIN: 14.3 g/dL (ref 13.0–17.0)
MCH: 29 pg (ref 26.0–34.0)
MCHC: 34.1 g/dL (ref 30.0–36.0)
MCV: 85 fL (ref 78.0–100.0)
PLATELETS: 177 10*3/uL (ref 150–400)
RBC: 4.93 MIL/uL (ref 4.22–5.81)
RDW: 13.5 % (ref 11.5–15.5)
WBC: 8.6 10*3/uL (ref 4.0–10.5)

## 2016-03-10 MED ORDER — ISOSORBIDE MONONITRATE ER 30 MG PO TB24: 15.0000 mg | ORAL_TABLET | Freq: Every day | ORAL | Status: DC

## 2016-03-10 MED ORDER — LISINOPRIL 2.5 MG PO TABS: 2.5000 mg | ORAL_TABLET | Freq: Every day | ORAL | Status: DC

## 2016-03-10 MED ORDER — LISINOPRIL 2.5 MG PO TABS
2.5000 mg | ORAL_TABLET | Freq: Every day | ORAL | Status: DC
  Administered 2016-03-10: 2.5 mg via ORAL
  Filled 2016-03-10: qty 1

## 2016-03-10 MED ORDER — METOPROLOL SUCCINATE ER 25 MG PO TB24: 25.0000 mg | ORAL_TABLET | Freq: Every day | ORAL | Status: DC

## 2016-03-10 MED ORDER — NITROGLYCERIN 0.4 MG SL SUBL: 0.4000 mg | SUBLINGUAL_TABLET | SUBLINGUAL | Status: DC | PRN

## 2016-03-10 MED ORDER — ATORVASTATIN CALCIUM 80 MG PO TABS: 80.0000 mg | ORAL_TABLET | Freq: Every day | ORAL | Status: DC

## 2016-03-10 MED ORDER — CLOPIDOGREL BISULFATE 75 MG PO TABS: 75.0000 mg | ORAL_TABLET | Freq: Every day | ORAL | Status: DC

## 2016-03-10 NOTE — Discharge Summary (Signed)
Discharge Summary    Patient ID: Frank Ayala,  MRN: [id number], DOB/AGE: Oct 28, 1940 75 y.o.  Admit date: 03/07/2016 Discharge date: 03/10/2016  Primary Care Provider: No primary care provider on file. Primary Cardiologist: Dr. Angelena Form  Discharge Diagnoses    Principal Problem:   NSTEMI (non-ST elevated myocardial infarction) [facility]) Active Problems:   COPD (chronic obstructive pulmonary disease) (HCC)   Asthma   Hyperlipidemia   Abnormal [place] (ultrasound) of abdomen   Allergies Allergies  Allergen Reactions  . Nsaids Shortness Of Breath    Diagnostic Studies/Procedures    LHC 03/07/16  Procedures    Left Heart Cath and Coronary Angiography    Conclusion     Ost RPDA lesion, 100% stenosed.  Mid LAD lesion, 100% stenosed.  There is mild to moderate left ventricular systolic dysfunction.  1. 2 vessel occlusive CAD.  -100% mid LAD after the second diagonal.   -100% flush occlusion of the PDA 2. Mild to moderate LV dysfunction   2D Echo 03/08/16  Study Conclusions  - Left ventricle: The cavity size was normal. Systolic function was  mildly to moderately reduced. The estimated ejection fraction was  in the range of 40% to 45%. Wall motion was normal; there were no  regional wall motion abnormalities. Doppler parameters are  consistent with abnormal left ventricular relaxation (grade 1  diastolic dysfunction). There was no evidence of elevated  ventricular filling pressure by Doppler parameters. - Aortic valve: Trileaflet; mildly thickened, mildly calcified  leaflets. There was no regurgitation. - Aortic root: The aortic root was normal in size. - Mitral valve: Calcified annulus. There was no regurgitation. - Left atrium: The atrium was mildly dilated. - Right ventricle: Systolic function was normal. - Tricuspid valve: There was trivial regurgitation. - Pulmonary arteries: Systolic pressure was within the normal  range. - Inferior vena  cava: The vessel was normal in size. - Pericardium, extracardiac: There was no pericardial effusion.  Impressions:  - Wall motion abnormalities suspicious for hibernation or infarct  in the LAD terrotory.  Abdominal MRI 03/08/16   IMPRESSION: 3.1 cm cystic lesion in the pancreatic body, with patent communication with the main pancreatic duct. No enhancement following contrast administration. Slight ductal prominence without definite ductal dilatation.  Differential considerations include pseudocyst or side branch IPMN (favored). Given size greater than 3 cm, EUS is suggested for Sampling/drainage.   History of Present Illness     75 y/o male with h/o COPD, HLD and asthma, who presented to [facility] on 03/07/16 with a complaint of new onset SSCP with associated dyspnea. Also with RUQ pain. He initially felt it was heartburn but had no relief with antacids. Given persistent pain, he presented to the [facility] ED for evaluation. EKG shows non-specific ST and T wave abnormalties. Troponin was mildly elevated at 0.31. An abdominal ultrasound was also performed in the ED give RUQ pain, which showed Cholelithiasis. No gallbladder wall thickening or pericholecystic fluid. Hypoechoic lesion in the body of the pancreas measuring 1.9 x 1.1 x 2.4 cm. Patient was admitted for further w/u.   Hospital Course      1. NSTEMI: given chest pain and abnormal troponin in the ED, further cardiac w/u was persued. Cardiac enzymes were cycled x 3 and trend was c/w NSTEMI, peaking at 10.68. He underwent a LHC per Dr. Martinique with occlusion of the mid LAD with filling of distal vessel from collaterals. A small PDA branch was also occluded and filled from collaterals. The occlusion was felt  to have been present for more than 12 hours given his chest pain. No PCI was performed. Echo with LVEF=40-45%. Anterior wall motion abnormality. Plan is for medical management of CAD for now. Will continue Plavix, statin, Imdur, Metoprolol and  lisinopril.   2. Pancreatic mass: F/u MRI on 03/08/16 with cystic lesion, possible IPMN vs benign cyst. He will need EUS in several weeks to better characterize with sampling.  [facility] was consulted on  03/08/16 and they did not feel that inpatient consult was necessary. They have arranged outpatient follow up with Dr. Paulita Fujita on 03/14/16 at 3:30pm.  The patient was seen and examined by Dr. Angelena Form on 03/10/16. He was stable w/o recurrent CP. No dyspnea. Cath site stable. Renal function and VSS. Dr. Angelena Form determined he was stable for discharge home. [facility] f/u has been arranged with   Consultants: No inpatient consults. GI outpatient referral placed.    Discharge Vitals Blood pressure 129/77, pulse 84, temperature 99.5 F (37.5 C), temperature source Oral, resp. rate 16, height 5\' 10"  (1.778 m), weight 213 lb 1.6 oz (96.662 kg), SpO2 96 %.  Filed Weights   03/09/16 0424 03/09/16 1552 03/10/16 0600  Weight: 214 lb 8 oz (97.297 kg) 215 lb 9.6 oz (97.796 kg) 213 lb 1.6 oz (96.662 kg)    Labs & Radiologic Studies    CBC  Recent Labs  03/09/16 0340 03/10/16 0303  WBC 8.7 8.6  HGB 14.6 14.3  HCT 42.1 41.9  MCV 84.7 85.0  PLT 182 [id number]   Basic Metabolic Panel  Recent Labs  03/09/16 0340 03/10/16 0303  NA 139 138  K 3.8 3.5  CL 101 101  CO2 27 27  GLUCOSE 101* 102*  BUN 10 12  CREATININE 0.88 0.89  CALCIUM 8.9 8.6*   Liver Function Tests No results for input(s): AST, ALT, ALKPHOS, BILITOT, PROT, ALBUMIN in the last 72 hours. No results for input(s): LIPASE, AMYLASE in the last 72 hours. Cardiac Enzymes  Recent Labs  03/07/16 1134 03/07/16 1916 03/07/16 2254  TROPONINI 3.19* 4.46* 10.68*   BNP Invalid input(s): POCBNP D-Dimer No results for input(s): DDIMER in the last 72 hours. Hemoglobin A1C  Recent Labs  03/07/16 1134  HGBA1C 6.0*   Fasting Lipid Panel  Recent Labs  03/08/16 0358  CHOL 182  HDL 25*  LDLCALC 132*  TRIG 127  CHOLHDL 7.3    Thyroid Function Tests  Recent Labs  03/07/16 1134  TSH 1.988   _____________  Dg Chest 2 View  03/07/2016  CLINICAL DATA:  Right-sided chest pain and right upper quadrant abdominal pain since 11 p.m. last night. EXAM: CHEST  2 VIEW COMPARISON:  None. FINDINGS: The heart size and mediastinal contours are within normal limits. Linear atelectasis in the lung bases. No focal consolidation. Degenerative changes in the spine. IMPRESSION: Atelectasis in the lung bases.  No active consolidation. Electronically Signed   By: Lucienne Capers M.D.   On: 03/07/2016 06:47   Mr Abdomen W Wo Contrast  03/08/2016  CLINICAL DATA:  Follow-up pancreatic lesions on ultrasound EXAM: MRI ABDOMEN WITHOUT AND WITH CONTRAST TECHNIQUE: Multiplanar multisequence MR imaging of the abdomen was performed both before and after the administration of intravenous contrast. CONTRAST:  49mL MULTIHANCE GADOBENATE DIMEGLUMINE 529 MG/ML IV SOLN COMPARISON:  Abdominal ultrasound dated 03/07/2016 FINDINGS: Motion degraded images. Lower chest: Mild patchy bilateral lower lobe opacities, likely atelectasis. Hepatobiliary: Scattered tiny hepatic cysts measuring up to 5 mm. No suspicious/enhancing hepatic lesions. Layering 6 mm gallstone (  series 6/ image 25). No associated inflammatory changes. No intrahepatic or extrahepatic ductal dilatation. Pancreas: Pancreas is notable for a cystic lesion arising along the inferior aspect of the main pancreatic duct within the pancreatic body, measuring 1.8 x 3.1 x 1.3 cm (series 6/ image 26). No solid component or definite enhancement following contrast administration. Patent communication with the main pancreatic duct (series 4/ image 16). Primary differential considerations include a pseudocyst or side branch IPMN. Slight ductal prominence without definite ductal dilatation. No pancreatic atrophy. Spleen: Within normal limits. Adrenals/Urinary Tract: Adrenal glands are within normal limits. Kidneys are  within normal limits.  No hydronephrosis. Stomach/Bowel: Stomach is within normal limits. Visualized bowel is unremarkable. Vascular/Lymphatic: No evidence of abdominal aortic aneurysm. No suspicious abdominal lymphadenopathy. Other: No abdominal ascites. Musculoskeletal: No focal osseous lesions. IMPRESSION: 3.1 cm cystic lesion in the pancreatic body, with patent communication with the main pancreatic duct. No enhancement following contrast administration. Slight ductal prominence without definite ductal dilatation. Differential considerations include pseudocyst or side branch IPMN (favored). Given size greater than 3 cm, EUS is suggested for sampling/drainage. Additional ancillary findings as above. Electronically Signed   By: Julian Hy M.D.   On: 03/08/2016 12:28   US Abdomen Complete  03/07/2016  CLINICAL DATA:  Right upper quadrant pain for 1 day EXAM: ABDOMEN ULTRASOUND COMPLETE COMPARISON:  None. FINDINGS: Gallbladder: Within the gallbladder, there is an 8 mm echogenic focus which moves and shadows consistent with cholelithiasis. There is no demonstrable gallbladder wall thickening or pericholecystic fluid. No sonographic Murphy sign noted by sonographer. Common bile duct: Diameter: 3 mm. There is no intrahepatic, common hepatic, or common bile duct dilatation. Liver: No focal lesion identified. Within normal limits in parenchymal echogenicity. IVC: No abnormality visualized. Pancreas: There is a hypoechoic lesion in the body of the pancreas measuring 1.9 x 1.1 x 2.4 cm. No other pancreatic lesion evident. Spleen: Size and appearance within normal limits. Right Kidney: Length: 11.1 cm. Echogenicity within normal limits. No mass or hydronephrosis visualized. Left Kidney: Length: 12.4 cm. Echogenicity within normal limits. No mass or hydronephrosis visualized. Abdominal aorta: No aneurysm visualized. Other findings: No demonstrable ascites. IMPRESSION: Cholelithiasis. No gallbladder wall thickening  or pericholecystic fluid. Hypoechoic lesion in the body of the pancreas measuring 1.9 x 1.1 x 2.4 cm. This finding warrants either pre and post-contrast MR or CT of the pancreas to further evaluate. Study otherwise unremarkable. Electronically Signed   By: Lowella Grip III M.D.   On: 03/07/2016 07:14   Disposition   Pt is being discharged home today in good condition.  Follow-up Plans & Appointments    Follow-up Information    Follow up with [name], MD.   Specialty:  Cardiology   Why:  our office will call you with an appointment eithe with Dr. Angelena Form or his PA/NP in 1-2 weeks    Contact information:   [place]. [address] [place] [id number] [phone]      Discharge Instructions    Amb Referral to Cardiac Rehabilitation    Complete by:  As directed   Diagnosis:  NSTEMI     Diet - low sodium heart healthy    Complete by:  As directed      Increase activity slowly    Complete by:  As directed            Discharge Medications   Current Discharge Medication List    START taking these medications   Details  atorvastatin (LIPITOR) 80 MG  tablet Take 1 tablet (80 mg total) by mouth daily at 6 PM. Qty: 30 tablet, Refills: 5    clopidogrel (PLAVIX) 75 MG tablet Take 1 tablet (75 mg total) by mouth daily. Qty: 30 tablet, Refills: 11    isosorbide mononitrate (IMDUR) 30 MG 24 hr tablet Take 0.5 tablets (15 mg total) by mouth daily. Qty: 30 tablet, Refills: 5    lisinopril (PRINIVIL,ZESTRIL) 2.5 MG tablet Take 1 tablet (2.5 mg total) by mouth daily. Qty: 30 tablet, Refills: 5    metoprolol succinate (TOPROL-XL) 25 MG 24 hr tablet Take 1 tablet (25 mg total) by mouth daily. Qty: 30 tablet, Refills: 5    nitroGLYCERIN (NITROSTAT) 0.4 MG SL tablet Place 1 tablet (0.4 mg total) under the tongue every 5 (five) minutes as needed for chest pain. Qty: 25 tablet, Refills: 2      CONTINUE these medications which have NOT CHANGED   Details    albuterol (PROVENTIL HFA;VENTOLIN HFA) 108 (90 Base) MCG/ACT inhaler Inhale 1-2 puffs into the lungs every 6 (six) hours as needed for wheezing or shortness of breath.    cetirizine (ZYRTEC) 10 MG tablet Take 10 mg by mouth daily as needed for allergies.    Fluticasone-Salmeterol (ADVAIR) 250-50 MCG/DOSE AEPB Inhale 1 puff into the lungs 2 (two) times daily as needed (shortness of breath).    loratadine (CLARITIN) 10 MG tablet Take 10 mg by mouth daily as needed for allergies.    Testosterone (FORTESTA) 10 MG/ACT (2%) GEL Place 40 mg onto the skin every evening.       STOP taking these medications     simvastatin (ZOCOR) 40 MG tablet          Aspirin prescribed at discharge?  Yes High Intensity Statin Prescribed? (Lipitor 40-80mg  or Crestor 20-40mg ): Yes Beta Blocker Prescribed? Yes For EF <40%, was ACEI/ARB Prescribed? Yes ADP Receptor Inhibitor Prescribed? (i.e. Plavix etc.-Includes Medically Managed Patients): Yes For EF <40%, Aldosterone Inhibitor Prescribed? No: no Was EF assessed during THIS hospitalization? Yes Was Cardiac Rehab II ordered? (Included Medically managed Patients): No:    Outstanding Labs/Studies   none  Duration of Discharge Encounter   Greater than 30 minutes including physician time.  Signed, Lyda Jester  PA-C  03/10/2016, 10:07 AM

## 2016-03-10 NOTE — Progress Notes (Signed)
SUBJECTIVE: No chest pain or SOB  Tele: sinus  BP 129/77 mmHg  Pulse 84  Temp(Src) 99.5 F (37.5 C) (Oral)  Resp 16  Ht 5\' 10"  (1.778 m)  Wt 213 lb 1.6 oz (96.662 kg)  BMI 30.58 kg/m2  SpO2 96%  Intake/Output Summary (Last 24 hours) at 03/10/16 [id number] Last data filed at 03/09/16 1500  Gross per 24 hour  Intake      0 ml  Output    200 ml  Net   -200 ml    PHYSICAL EXAM General: Well developed, well nourished, in no acute distress. Alert and oriented x 3.  Psych:  Good affect, responds appropriately Neck: No JVD. No masses noted.  Lungs: Clear bilaterally with no wheezes or rhonci noted.  Heart: RRR with no murmurs noted. Abdomen: Bowel sounds are present. Soft, non-tender.  Extremities: No lower extremity edema.   LABS: Basic Metabolic Panel:  Recent Labs  03/09/16 0340 03/10/16 0303  NA 139 138  K 3.8 3.5  CL 101 101  CO2 27 27  GLUCOSE 101* 102*  BUN 10 12  CREATININE 0.88 0.89  CALCIUM 8.9 8.6*   CBC:  Recent Labs  03/09/16 0340 03/10/16 0303  WBC 8.7 8.6  HGB 14.6 14.3  HCT 42.1 41.9  MCV 84.7 85.0  PLT 182 177   Cardiac Enzymes:  Recent Labs  03/07/16 1134 03/07/16 1916 03/07/16 2254  TROPONINI 3.19* 4.46* 10.68*   Fasting Lipid Panel:  Recent Labs  03/08/16 0358  CHOL 182  HDL 25*  LDLCALC 132*  TRIG 127  CHOLHDL 7.3    Current Meds: . atorvastatin  80 mg Oral q1800  . clopidogrel  75 mg Oral Daily  . enoxaparin (LOVENOX) injection  40 mg Subcutaneous Q24H  . isosorbide mononitrate  15 mg Oral Daily  . loratadine  10 mg Oral Daily  . metoprolol succinate  25 mg Oral Daily  . mometasone-formoterol  2 puff Inhalation BID  . ondansetron  4 mg Intravenous Once  . sodium chloride flush  3 mL Intravenous Q12H  . sodium chloride flush  3 mL Intravenous Q12H  . Testosterone  1 application Transdermal QPM   Echo 03/07/16: Left ventricle: The cavity size was normal. Systolic function was  mildly to moderately reduced.  The estimated ejection fraction was  in the range of 40% to 45%. Wall motion was normal; there were no  regional wall motion abnormalities. Doppler parameters are  consistent with abnormal left ventricular relaxation (grade 1  diastolic dysfunction). There was no evidence of elevated  ventricular filling pressure by Doppler parameters. - Aortic valve: Trileaflet; mildly thickened, mildly calcified  leaflets. There was no regurgitation. - Aortic root: The aortic root was normal in size. - Mitral valve: Calcified annulus. There was no regurgitation. - Left atrium: The atrium was mildly dilated. - Right ventricle: Systolic function was normal. - Tricuspid valve: There was trivial regurgitation. - Pulmonary arteries: Systolic pressure was within the normal  range. - Inferior vena cava: The vessel was normal in size. - Pericardium, extracardiac: There was no pericardial effusion.  Impressions:  - Wall motion abnormalities suspicious for hibernation or infarct  in the LAD terrotory.  ASSESSMENT AND PLAN:  1. CAD/NSTEMI/Ischemic cardiomyopathy: Pt admitted with chest pain, elevated troponin on 03/07/16. Cardiac cath per Dr. Martinique with occlusion of the mid LAD with filling of distal vessel from collaterals. A small PDA branch was also occluded and filled from collaterals. The occlusion was felt  to have been present for more than 12 hours given his chest pain. No PCI was performed. Echo with LVEF=40-45%. Anterior wall motion abnormality. Plan medical management of CAD for now. Will continue Plavix, statin, Imdur, beta blocker. (of note, he reports an intolerance/allergy to NSAIDS). Will add Lisinopril 2.5 mg daily.    2. 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 He will need EUS in several weeks to better characterize with sampling. I consulted [facility] 03/08/16 and they did not feel that in inpatient consult was necessary. They have arranged  outpatient follow up with Dr. Paulita Fujita on 03/14/16 at 3:30pm. I have passed along the information obtained from Dr. Watt Climes yesterday.   Discharge home today. Follow up with me or office APP 1-2 weeks. Follow up with [facility] as above.  [name]  5/18/20179:09 AM

## 2016-03-10 NOTE — Progress Notes (Signed)
Frank Ayala to be D/C'd Home per MD order. Discussed with the patient and all questions fully answered.    VVS, Skin clean, dry and intact without evidence of skin break down, no evidence of skin tears noted.  IV catheter discontinued intact. Site without signs and symptoms of complications. Dressing and pressure applied.  An After Visit Summary was printed and given to the patient.  Patient escorted via [place], and D/C home via private auto.  Cyndra Numbers  03/10/2016 12:17 PM

## 2016-03-10 NOTE — Care Management Note (Signed)
Case Management Note Marvetta Gibbons RN, BSN Unit 2W-Case Manager [phone]  Patient Details  Name: Frank Ayala MRN: [id number] Date of Birth: 27-Jul-1941  Subjective/Objective:  Pt admitted with NSTEMI                  Action/Plan: PTA pt lived at home with wife- anticipate return home- no needs noted  Expected Discharge Date:  03/10/16               Expected Discharge Plan:  [place]  In-House Referral:     Discharge planning Services  CM Consult  Post Acute Care Choice:    Choice offered to:     DME Arranged:    DME Agency:     HH Arranged:    [place] Agency:     Status of Service:  Completed, signed off  Medicare Important Message Given:  Yes Date Medicare IM Given:    Medicare IM give by:    Date Additional Medicare IM Given:    Additional Medicare Important Message give by:     If discussed at [place] of Stay Meetings, dates discussed:    Additional Comments:  Dawayne Patricia, RN 03/10/2016, 10:38 AM

## 2016-03-10 NOTE — Care Management Important Message (Signed)
Important Message  Patient Details  Name: Frank Ayala MRN: [id number] Date of Birth: [birth year]   Medicare Important Message Given:  Yes    Dawayne Patricia, RN 03/10/2016, 10:37 AM

## 2016-03-11 ENCOUNTER — Telehealth: Payer: Self-pay | Admitting: Cardiovascular Disease

## 2016-03-11 NOTE — Telephone Encounter (Signed)
Patient contacted regarding discharge from [facility] on 03/10/2016.  Patient understands to follow up with provider Melina Copa on 03/18/16 at 10:00 AM at [facility] location. Patient understands discharge instructions? yes Patient understands medications and regiment? yes Patient understands to bring all medications to this visit? yes   The pt complained of diarrhea since he left the hospital and he wondered if this is medication related.  The pt did not have diarrhea prior to hospitalization.  In reviewing the pt's new medications I made him aware that Atorvastatin could be causing some loose stools.  The pt did have GI issues during hospitalization and he is scheduled to see GI on 03/14/16.  At this time I recommended that the pt try Imodium OTC and keep follow-up with GI.  If the pt has other questions or concerns I advised him to contact our on call staff over the weekend. Pt agreed with plan.

## 2016-03-11 NOTE — Telephone Encounter (Signed)
New message      TCM appt on 03-18-16 with Melina Copa

## 2016-03-16 ENCOUNTER — Encounter: Payer: Self-pay | Admitting: Physician Assistant

## 2016-03-16 DIAGNOSIS — I2510 Atherosclerotic heart disease of native coronary artery without angina pectoris: Secondary | ICD-10-CM | POA: Insufficient documentation

## 2016-03-18 ENCOUNTER — Ambulatory Visit (INDEPENDENT_AMBULATORY_CARE_PROVIDER_SITE_OTHER): Payer: Medicare Other | Admitting: Physician Assistant

## 2016-03-18 ENCOUNTER — Encounter: Payer: Self-pay | Admitting: Physician Assistant

## 2016-03-18 VITALS — BP 115/68 | HR 73 | Ht 70.0 in | Wt 215.8 lb

## 2016-03-18 DIAGNOSIS — I2510 Atherosclerotic heart disease of native coronary artery without angina pectoris: Secondary | ICD-10-CM | POA: Diagnosis not present

## 2016-03-18 DIAGNOSIS — Z9861 Coronary angioplasty status: Secondary | ICD-10-CM

## 2016-03-18 DIAGNOSIS — I214 Non-ST elevation (NSTEMI) myocardial infarction: Secondary | ICD-10-CM

## 2016-03-18 DIAGNOSIS — E785 Hyperlipidemia, unspecified: Secondary | ICD-10-CM

## 2016-03-18 DIAGNOSIS — Q453 Other congenital malformations of pancreas and pancreatic duct: Secondary | ICD-10-CM

## 2016-03-18 DIAGNOSIS — I255 Ischemic cardiomyopathy: Secondary | ICD-10-CM

## 2016-03-18 DIAGNOSIS — Z79899 Other long term (current) drug therapy: Secondary | ICD-10-CM

## 2016-03-18 NOTE — Progress Notes (Signed)
Cardiology Office Note    Date:  03/18/2016  ID:  Frank Ayala, Frank Ayala 1941/04/26, MRN [id number] PCP:  Philis Fendt, MD  Cardiologist:  Dr. Angelena Form  Chief Complaint: f/u hospitalization for NSTEMI  History of Present Illness:  Frank Ayala is a 75 y.o. male with history of COPD, asthma, hyperlipidemia, CAD (NSTEMI 02/2016 with occ mLAD & occ small PDA both with collaterals, treated medically), ICM EF (~40-50%) who presents for post-hospital f/u. He was recently admitted earlier this month for SSCP and dyspnea. He ruled in for NSTEMI (peak trop 10.68) and underwent LHC showing occlusion of mid LAD with filling of distal vessel from collaterals; small PDA also occluded and filled from collaterals, felt to be present for more than 12 hours given chest pain - plan was for med rx). EF 45-50% by cath, 40-45% by echo. (2D Echo 03/07/16: EF 40-45%, grade 1 DD, wall motion abnormalities suspicious for hibernation or infarct in LAD territory). Labs otherwise notable for LDL 132, nl LFTs, neg d-dimer. He is not on ASA due to h/o intolerance of NSAIDS per d/w Dr. Angelena Form. Simvastatin was changed to Lipitor. He was also found to have a pancreatic lesion by abd [place] (MRI raising question of pseudocyst or side branch IPMN) - [facility] did not feel inpatient consult was necessary and arranged OP f/u. He followed up with GI - Dr. Paulita Fujita called Dr. Angelena Form yesterday and the current plan is to follow the lesion conservatively.   He comes in for follow-up today and is stable from a cardiac perspective. He has not had any recurrent CP. No SOB, syncope, dizziness, LEE, orthopnea or bleeding. He had diarrhea x 1 day after discharge but this resolved. He does report daily headaches since discharge relieved with Tylenol. No other neurologic sx. He has had some fatigue.   Past Medical History  Diagnosis Date  . COPD (chronic obstructive pulmonary disease) ([place])   . Asthma   . Hyperlipidemia   . CAD in native artery      a. NSTEMI 02/2016 with occ mLAD & occ small PDA both with collaterals, treated medically  . Ischemic cardiomyopathy     a. 02/2016: EF 45-50% by cath, 40-45% by echo.  . Pancreatic lesion     Past Surgical History  Procedure Laterality Date  . Cardiac catheterization N/A 03/07/2016    Procedure: Left Heart Cath and Coronary Angiography;  Surgeon: [name], MD;  Location: [facility];  Service: Cardiovascular;  Laterality: N/A;    Current Medications: Outpatient Prescriptions Prior to Visit  Medication Sig Dispense Refill  . atorvastatin (LIPITOR) 80 MG tablet Take 1 tablet (80 mg total) by mouth daily at 6 PM. 30 tablet 5  . clopidogrel (PLAVIX) 75 MG tablet Take 1 tablet (75 mg total) by mouth daily. 30 tablet 11  . Fluticasone-Salmeterol (ADVAIR) 250-50 MCG/DOSE AEPB Inhale 1 puff into the lungs 2 (two) times daily as needed (shortness of breath).    Marland Kitchen lisinopril (PRINIVIL,ZESTRIL) 2.5 MG tablet Take 1 tablet (2.5 mg total) by mouth daily. 30 tablet 5  . metoprolol succinate (TOPROL-XL) 25 MG 24 hr tablet Take 1 tablet (25 mg total) by mouth daily. 30 tablet 5  . Testosterone (FORTESTA) 10 MG/ACT (2%) GEL Place 40 mg onto the skin every evening. 4 pumps    . albuterol (PROVENTIL HFA;VENTOLIN HFA) 108 (90 Base) MCG/ACT inhaler Inhale 1-2 puffs into the lungs every 6 (six) hours as needed for wheezing or shortness of  breath.    . cetirizine (ZYRTEC) 10 MG tablet Take 10 mg by mouth daily as needed for allergies.    . isosorbide mononitrate (IMDUR) 30 MG 24 hr tablet Take 0.5 tablets (15 mg total) by mouth daily. 30 tablet 5  . loratadine (CLARITIN) 10 MG tablet Take 10 mg by mouth daily as needed for allergies.    . nitroGLYCERIN (NITROSTAT) 0.4 MG SL tablet Place 1 tablet (0.4 mg total) under the tongue every 5 (five) minutes as needed for chest pain. 25 tablet 2   No facility-administered medications prior to visit.     Allergies:   Nsaids   Social History   Social  History  . Marital Status: Married    Spouse Name: N/A  . Number of Children: N/A  . Years of Education: N/A   Occupational History  . Retired Nature conservation officer and [employer]    Social History Main Topics  . Smoking status: Never Smoker   . Smokeless tobacco: None  . Alcohol Use: No  . Drug Use: No  . Sexual Activity: Not Asked   Other Topics Concern  . None   Social History Narrative   Lives in [place] with wife     Family History:  The patient's *family history includes Cancer in his father.   ROS:   Please see the history of present illness.  All other systems are reviewed and otherwise negative.    PHYSICAL EXAM:   VS:  BP 115/68 mmHg  Pulse 73  Ht 5\' 10"  (1.778 m)  Wt 215 lb 12.8 oz (97.886 kg)  BMI 30.96 kg/m2  BMI: Body mass index is 30.96 kg/(m^2). GEN: Well nourished, well developed WM, in no acute distress HEENT: normocephalic, atraumatic Neck: no JVD, carotid bruits, or masses Cardiac: RRR; no murmurs, rubs, or gallops, no edema. Right radial cath site without hematoma or ecchymosis; good pulse. Respiratory:  clear to auscultation bilaterally, normal work of breathing GI: soft, nontender, nondistended, + BS MS: no deformity or atrophy Skin: warm and dry, no rash Neuro:  Alert and Oriented x 3, Strength and sensation are intact, follows commands Psych: euthymic mood, full affect  Wt Readings from Last 3 Encounters:  03/18/16 215 lb 12.8 oz (97.886 kg)  03/10/16 213 lb 1.6 oz (96.662 kg)      Studies/Labs Reviewed:   EKG:  EKG was ordered today and personally reviewed by me. The EKG ordered today demonstrates NSR sinus arrhythmia 73bpm, nonspecific ST-T changes - unchanged from prior.  Recent Labs: 03/07/2016: ALT 14*; TSH 1.988 03/10/2016: BUN 12; Creatinine, Ser 0.89; Hemoglobin 14.3; Platelets 177; Potassium 3.5; Sodium 138   Lipid Panel    Component Value Date/Time   CHOL 182 03/08/2016 0358   TRIG 127 03/08/2016 0358   HDL 25* 03/08/2016 0358   CHOLHDL 7.3  03/08/2016 0358   VLDL 25 03/08/2016 0358   LDLCALC 132* 03/08/2016 0358    Additional studies/ records that were reviewed today include: Summarized above.    ASSESSMENT & PLAN:   1. CAD - doing well post-discharge. Continue Plavix, metoprolol, and statin. He is not on ASA due to refusal due to h/o intolerance to NSAIDs per clarification with Dr. Angelena Form. He has noticed headaches since his cardiac meds were initiated and I think this may be related to isosorbide - will stop this today. If fatigue persists we can consider changing beta blocker to a different medication but I prefer to only make one med change at a time. He plans to participate  in cardiac rehab which should help with endurance. 2. Ischemic cardiomyopathy - reviewed CHF precautions including daily weights, low salt diet with patient. Continue BB and ACEI. 3. Pancreatic lesion - being followed with imaging by Dr. Paulita Fujita. 4. Hyperlipidemia - recheck CMET/lipids in 6 weeks. Continue statin.  Disposition: F/u with Dr. Angelena Form in 3 months.   Medication Adjustments/Labs and Tests Ordered: Current medicines are reviewed at length with the patient today.  Concerns regarding medicines are outlined above. Medication changes, Labs and Tests ordered today are listed in the Patient Instructions below.   Raechel Ache PA-C  03/18/2016 10:34 AM    [address], [place], [place]  [address] Phone: [phone]; Fax: [phone]

## 2016-03-18 NOTE — Patient Instructions (Addendum)
Medication Instructions:  Your physician has recommended you make the following change in your medication:  1) STOP Imdur  Labwork: Your physician recommends that you return for lab work in: 6 weeks for fasting lipid panel & CMET  -- you need to be fasting for this lab work.  Testing/Procedures: None ordered  Follow-Up: Your physician recommends that you schedule a follow-up appointment in: 3 months with Dr. Angelena Form   Any Other Special Instructions Will Be Listed Below (If Applicable).  One of your heart tests showed weakness of the heart muscle during your recent hospital stay. This may make you more susceptible to weight gain from fluid retention, which can lead to symptoms that we call heart failure. Please follow these special instructions. 1. Follow a low-salt diet and watch your fluid intake. In general, you should not be taking in more than 2 liters of fluid per day (no more than 8 glasses per day). Some patients are restricted to less than 1.5 liters of fluid per day (no more than 6 glasses per day). This includes sources of water in foods like soup, coffee, tea, milk, etc. 2. Weigh yourself on the same scale at same time of day and keep a log. 3. Call your doctor: (Anytime you feel any of the following symptoms)  - 3-4 pound weight gain in 1-2 days or 2 pounds overnight  - Shortness of breath, with or without a dry hacking cough  - Swelling in the hands, feet or stomach  - If you have to sleep on extra pillows at night in order to breathe   IT IS IMPORTANT TO LET YOUR DOCTOR KNOW EARLY ON IF YOU ARE HAVING SYMPTOMS SO WE CAN HELP YOU!   If you need a refill on your cardiac medications before your next appointment, please call your pharmacy.  Thank you for choosing [facility]!!

## 2016-03-23 ENCOUNTER — Encounter (HOSPITAL_COMMUNITY): Admission: RE | Payer: Self-pay | Source: Ambulatory Visit

## 2016-03-23 ENCOUNTER — Ambulatory Visit (HOSPITAL_COMMUNITY): Admission: RE | Admit: 2016-03-23 | Payer: Medicare Other | Source: Ambulatory Visit | Admitting: Gastroenterology

## 2016-03-23 SURGERY — ESOPHAGEAL ENDOSCOPIC ULTRASOUND (EUS) RADIAL
Anesthesia: Monitor Anesthesia Care

## 2016-04-19 ENCOUNTER — Encounter (HOSPITAL_COMMUNITY)
Admission: RE | Admit: 2016-04-19 | Discharge: 2016-04-19 | Disposition: A | Discharge status: Home / Self Care | Payer: Medicare Other | Source: Ambulatory Visit | Attending: Cardiovascular Disease | Admitting: Cardiovascular Disease

## 2016-04-19 VITALS — BP 134/64 | HR 92 | Ht 68.25 in | Wt 212.3 lb

## 2016-04-19 DIAGNOSIS — Z955 Presence of coronary angioplasty implant and graft: Secondary | ICD-10-CM | POA: Insufficient documentation

## 2016-04-19 DIAGNOSIS — I214 Non-ST elevation (NSTEMI) myocardial infarction: Secondary | ICD-10-CM | POA: Diagnosis present

## 2016-04-19 NOTE — Progress Notes (Addendum)
Cardiac Rehab Medication Review by a Pharmacist  Does the patient  feel that his/her medications are working for him/her?  no  Has the patient been experiencing any side effects to the medications prescribed?  no  Does the patient measure his/her own blood pressure or blood glucose at home?  Yes, every once in while  Does the patient have any problems obtaining medications due to transportation or finances?   no  Understanding of regimen: fair Understanding of indications: fair Potential of compliance: fair    Pharmacist comments: Pt did not have a good understanding of what his medication were for, from talking to the patient it sound like his wife helps a lot with his medications and she was not present for this visit. He reports taking his Advair as needed and not scheduled. He also reports using his albuterol daily not as needed. I reviewed the correct dosing with the patient.   Darl Pikes 04/19/2016 2:52 PM

## 2016-04-20 NOTE — Progress Notes (Addendum)
Cardiac Individual Treatment Plan  Patient Details  Name: Frank Ayala MRN: [id number] Date of Birth: [birth year] Referring Provider:        CARDIAC REHAB PHASE II ORIENTATION from 04/19/2016 in [place]   Referring Provider  [name], MD      Initial Encounter Date:       CARDIAC REHAB PHASE II ORIENTATION from 04/19/2016 in [place]   Date  04/19/16   Referring Provider  [name], MD      Visit Diagnosis: NSTEMI (non-ST elevated myocardial infarction) [facility])  Patient's Home Medications on Admission:  Current outpatient prescriptions:  .  albuterol (PROVENTIL) (2.5 MG/3ML) 0.083% nebulizer solution, Take 2.5 mg by nebulization every 6 (six) hours as needed for wheezing or shortness of breath., Disp: , Rfl:  .  atorvastatin (LIPITOR) 80 MG tablet, Take 1 tablet (80 mg total) by mouth daily at 6 PM., Disp: 30 tablet, Rfl: 5 .  clopidogrel (PLAVIX) 75 MG tablet, Take 1 tablet (75 mg total) by mouth daily., Disp: 30 tablet, Rfl: 11 .  lisinopril (PRINIVIL,ZESTRIL) 2.5 MG tablet, Take 1 tablet (2.5 mg total) by mouth daily., Disp: 30 tablet, Rfl: 5 .  metoprolol succinate (TOPROL-XL) 25 MG 24 hr tablet, Take 1 tablet (25 mg total) by mouth daily., Disp: 30 tablet, Rfl: 5 .  Testosterone (FORTESTA) 10 MG/ACT (2%) GEL, Place 40 mg onto the skin every evening. 4 pumps, Disp: , Rfl:  .  Fluticasone-Salmeterol (ADVAIR) 250-50 MCG/DOSE AEPB, Inhale 1 puff into the lungs 2 (two) times daily as needed (shortness of breath). Reported on 04/19/2016, Disp: , Rfl:   Past Medical History: Past Medical History  Diagnosis Date  . COPD (chronic obstructive pulmonary disease) ([place])   . Asthma   . Hyperlipidemia   . CAD in native artery     a. NSTEMI 02/2016 with occ mLAD & occ small PDA both with collaterals, treated medically  . Ischemic cardiomyopathy     a. 02/2016: EF 45-50% by cath, 40-45% by echo.  .  Pancreatic lesion     Tobacco Use: History  Smoking status  . Never Smoker   Smokeless tobacco  . Not on file    Labs: Recent Review Flowsheet Data    Labs for ITP Cardiac and Pulmonary Rehab Latest Ref Rng 03/07/2016 03/08/2016   Cholestrol 0 - 200 mg/dL - 182   LDLCALC 0 - 99 mg/dL - 132(H)   HDL >40 mg/dL - 25(L)   Trlycerides <150 mg/dL - 127   Hemoglobin A1c 4.8 - 5.6 % 6.0(H) -      Capillary Blood Glucose: Lab Results  Component Value Date   GLUCAP 74 03/09/2016   GLUCAP 96 03/09/2016   GLUCAP 107* 03/08/2016   GLUCAP 130* 03/07/2016     Exercise Target Goals: Date: 04/19/16  Exercise Program Goal: Individual exercise prescription set with THRR, safety & activity barriers. Participant demonstrates ability to understand and report RPE using BORG scale, to self-measure pulse accurately, and to acknowledge the importance of the exercise prescription.  Exercise Prescription Goal: Starting with aerobic activity 30 plus minutes a day, 3 days per week for initial exercise prescription. Provide home exercise prescription and guidelines that participant acknowledges understanding prior to discharge.  Activity Barriers & Risk Stratification:     Activity Barriers & Cardiac Risk Stratification - 04/19/16 1402    Activity Barriers & Cardiac Risk Stratification   Activity Barriers Neck/Spine Problems   Cardiac Risk  Stratification Moderate      6 Minute Walk:     6 Minute Walk      04/19/16 1629       6 Minute Walk   Phase Initial     Distance 1617 feet     Walk Time 6 minutes     # of Rest Breaks 0     MPH 3.06     METS 3.33     RPE 11     VO2 Peak 11.64     Symptoms No     Resting HR 92 bpm     Resting BP 134/64 mmHg     Max Ex. HR 123 bpm     Max Ex. BP 144/62 mmHg     2 Minute Post BP 128/68 mmHg        Initial Exercise Prescription:     Initial Exercise Prescription - 04/20/16 0700    Date of Initial Exercise RX and Referring Provider    Date 04/19/16   Referring Provider [name], MD   Treadmill   MPH 2.2   Grade 1   Minutes 10   METs 2.99   Bike   Level 0.9   Minutes 10   METs 2.79   NuStep   Level 2   Minutes 10   METs 2.5   Prescription Details   Frequency (times per week) 3   Duration Progress to 30 minutes of continuous aerobic without signs/symptoms of physical distress   Intensity   THRR 40-80% of Max Heartrate 58-117   Ratings of Perceived Exertion 11-13   Perceived Dyspnea 0-4   Progression   Progression Continue to progress workloads to maintain intensity without signs/symptoms of physical distress.   Resistance Training   Training Prescription Yes   Weight 2 lb.   Reps 10-12      Perform Capillary Blood Glucose checks as needed.  Exercise Prescription Changes:   Exercise Comments:   Discharge Exercise Prescription (Final Exercise Prescription Changes):   Nutrition:  Target Goals: Understanding of nutrition guidelines, daily intake of sodium 1500mg , cholesterol 200mg , calories 30% from fat and 7% or less from saturated fats, daily to have 5 or more servings of fruits and vegetables.  Biometrics:     Pre Biometrics - 04/19/16 1627    Pre Biometrics   Height 5' 8.25" (1.734 m)   Weight 212 lb 4.9 oz (96.3 kg)   Waist Circumference 40.5 inches   Hip Circumference 44.5 inches   Waist to Hip Ratio 0.91 %   BMI (Calculated) 32.1   Triceps Skinfold 21 mm   % Body Fat 30.9 %   Grip Strength 42 kg   Flexibility 7 in   Single Leg Stand 7.59 seconds       Nutrition Therapy Plan and Nutrition Goals:   Nutrition Discharge: Nutrition Scores:   Nutrition Goals Re-Evaluation:   Psychosocial: Target Goals: Acknowledge presence or absence of depression, maximize coping skills, provide positive support system. Participant is able to verbalize types and ability to use techniques and skills needed for reducing stress and depression.  Initial Review & Psychosocial  Screening:   Quality of Life Scores:   PHQ-9:     Recent Review Flowsheet Data    There is no flowsheet data to display.      Psychosocial Evaluation and Intervention:   Psychosocial Re-Evaluation:   Vocational Rehabilitation: Provide vocational rehab assistance to qualifying candidates.   Vocational Rehab Evaluation & Intervention:   Education: Education Goals: Education  classes will be provided on a weekly basis, covering required topics. Participant will state understanding/return demonstration of topics presented.  Learning Barriers/Preferences:   Education Topics: Count Your Pulse:  -Group instruction provided by verbal instruction, demonstration, patient participation and written materials to support subject.  Instructors address importance of being able to find your pulse and how to count your pulse when at home without a heart monitor.  Patients get hands on experience counting their pulse with staff help and individually.   Heart Attack, Angina, and Risk Factor Modification:  -Group instruction provided by verbal instruction, video, and written materials to support subject.  Instructors address signs and symptoms of angina and heart attacks.    Also discuss risk factors for heart disease and how to make changes to improve heart health risk factors.   Functional Fitness:  -Group instruction provided by verbal instruction, demonstration, patient participation, and written materials to support subject.  Instructors address safety measures for doing things around the house.  Discuss how to get up and down off the floor, how to pick things up properly, how to safely get out of a chair without assistance, and balance training.   Meditation and Mindfulness:  -Group instruction provided by verbal instruction, patient participation, and written materials to support subject.  Instructor addresses importance of mindfulness and meditation practice to help reduce stress and  improve awareness.  Instructor also leads participants through a meditation exercise.    Stretching for Flexibility and Mobility:  -Group instruction provided by verbal instruction, patient participation, and written materials to support subject.  Instructors lead participants through series of stretches that are designed to increase flexibility thus improving mobility.  These stretches are additional exercise for major muscle groups that are typically performed during regular warm up and cool down.   Hands Only CPR Anytime:  -Group instruction provided by verbal instruction, video, patient participation and written materials to support subject.  Instructors co-teach with AHA video for hands only CPR.  Participants get hands on experience with mannequins.   Nutrition I class: Heart Healthy Eating:  -Group instruction provided by PowerPoint slides, verbal discussion, and written materials to support subject matter. The instructor gives an explanation and review of the Therapeutic Lifestyle Changes diet recommendations, which includes a discussion on lipid goals, dietary fat, sodium, fiber, plant stanol/sterol esters, sugar, and the components of a well-balanced, healthy diet.   Nutrition II class: Lifestyle Skills:  -Group instruction provided by PowerPoint slides, verbal discussion, and written materials to support subject matter. The instructor gives an explanation and review of label reading, grocery shopping for heart health, heart healthy recipe modifications, and ways to make healthier choices when eating out.   Diabetes Question & Answer:  -Group instruction provided by PowerPoint slides, verbal discussion, and written materials to support subject matter. The instructor gives an explanation and review of diabetes co-morbidities, pre- and post-prandial blood glucose goals, pre-exercise blood glucose goals, signs, symptoms, and treatment of hypoglycemia and hyperglycemia, and foot care  basics.   Diabetes Blitz:  -Group instruction provided by PowerPoint slides, verbal discussion, and written materials to support subject matter. The instructor gives an explanation and review of the physiology behind type 1 and type 2 diabetes, diabetes medications and rational behind using different medications, pre- and post-prandial blood glucose recommendations and Hemoglobin A1c goals, diabetes diet, and exercise including blood glucose guidelines for exercising safely.    Portion Distortion:  -Group instruction provided by PowerPoint slides, verbal discussion, written materials, and food models to support  subject matter. The instructor gives an explanation of serving size versus portion size, changes in portions sizes over the last 20 years, and what consists of a serving from each food group.   Stress Management:  -Group instruction provided by verbal instruction, video, and written materials to support subject matter.  Instructors review role of stress in heart disease and how to cope with stress positively.     Exercising on Your Own:  -Group instruction provided by verbal instruction, power point, and written materials to support subject.  Instructors discuss benefits of exercise, components of exercise, frequency and intensity of exercise, and end points for exercise.  Also discuss use of nitroglycerin and activating EMS.  Review options of places to exercise outside of rehab.  Review guidelines for sex with heart disease.   Cardiac Drugs I:  -Group instruction provided by verbal instruction and written materials to support subject.  Instructor reviews cardiac drug classes: antiplatelets, anticoagulants, beta blockers, and statins.  Instructor discusses reasons, side effects, and lifestyle considerations for each drug class.   Cardiac Drugs II:  -Group instruction provided by verbal instruction and written materials to support subject.  Instructor reviews cardiac drug classes:  angiotensin converting enzyme inhibitors (ACE-I), angiotensin II receptor blockers (ARBs), nitrates, and calcium channel blockers.  Instructor discusses reasons, side effects, and lifestyle considerations for each drug class.   Anatomy and Physiology of the Circulatory System:  -Group instruction provided by verbal instruction, video, and written materials to support subject.  Reviews functional anatomy of heart, how it relates to various diagnoses, and what role the heart plays in the overall system.   Knowledge Questionnaire Score:   Core Components/Risk Factors/Patient Goals at Admission:     Personal Goals and Risk Factors at Admission - 04/19/16 1630    Core Components/Risk Factors/Patient Goals on Admission    Weight Management Obesity   Increase Strength and Stamina Yes   Intervention Provide advice, education, support and counseling about physical activity/exercise needs.;Develop an individualized exercise prescription for aerobic and resistive training based on initial evaluation findings, risk stratification, comorbidities and participant's personal goals.   Expected Outcomes Achievement of increased cardiorespiratory fitness and enhanced flexibility, muscular endurance and strength shown through measurements of functional capacity and personal statement of participant.   Lipids Yes   Intervention Provide education and support for participant on nutrition & aerobic/resistive exercise along with prescribed medications to achieve LDL 70mg , HDL >40mg .   Expected Outcomes Short Term: Participant states understanding of desired cholesterol values and is compliant with medications prescribed. Participant is following exercise prescription and nutrition guidelines.;Long Term: Cholesterol controlled with medications as prescribed, with individualized exercise RX and with personalized nutrition plan. Value goals: LDL < 70mg , HDL > 40 mg.      Core Components/Risk Factors/Patient Goals  Review:    Core Components/Risk Factors/Patient Goals at Discharge (Final Review):    ITP Comments:     ITP Comments      04/19/16 1349           ITP Comments Medical Director - Dr. Fransico Him, MD          Comments: Patient attended orientation from 1330  to 1530 to review rules and guidelines for program. Completed 6 minute walk test, Intitial ITP, and exercise prescription.  VSS. Telemetry-sinus rhythm.   Asymptomatic.

## 2016-04-25 ENCOUNTER — Encounter (HOSPITAL_COMMUNITY): Payer: Medicare Other

## 2016-04-27 ENCOUNTER — Encounter (HOSPITAL_COMMUNITY): Payer: Self-pay

## 2016-04-27 ENCOUNTER — Encounter (HOSPITAL_COMMUNITY)
Admission: RE | Admit: 2016-04-27 | Discharge: 2016-04-27 | Disposition: A | Discharge status: Home / Self Care | Payer: Medicare Other | Source: Ambulatory Visit | Attending: Cardiovascular Disease | Admitting: Cardiovascular Disease

## 2016-04-27 DIAGNOSIS — I214 Non-ST elevation (NSTEMI) myocardial infarction: Secondary | ICD-10-CM | POA: Diagnosis not present

## 2016-04-27 DIAGNOSIS — Z955 Presence of coronary angioplasty implant and graft: Secondary | ICD-10-CM | POA: Insufficient documentation

## 2016-04-27 NOTE — Progress Notes (Signed)
Daily Session Note  Patient Details  Name: Frank Ayala MRN: [id number] Date of Birth: August 28, 1941 Referring Provider:        CARDIAC REHAB PHASE II ORIENTATION from 04/19/2016 in [place]   Referring Provider  [name], MD      Encounter Date: 04/27/2016  Check In:     Session Check In - 04/27/16 3779    Check-In   Location [facility]   Staff Present Seward Carol, MS, ACSM CEP, Exercise Physiologist;Joann Rion, RN, Marga Melnick, RN, BSN;Carlette Wilber Oliphant, RN, BSN   Supervising physician immediately available to respond to emergencies [facility] immediately available   Physician(s) Dr. Eulas Post   Medication changes reported     No   Fall or balance concerns reported    No   Warm-up and Cool-down Performed as group-led instruction   Resistance Training Performed No   VAD Patient? No   Pain Assessment   Currently in Pain? No/denies      Capillary Blood Glucose: No results found for this or any previous visit (from the past 24 hour(s)).   Goals Met:  Exercise tolerated well  Goals Unmet:  Not Applicable  Comments: Pt started cardiac rehab today.  Pt tolerated light exercise without difficulty. VSS, telemetry-normal sinus rhythm,  asymptomatic.  Medication list reconciled. Pt denies barriers to medicaiton compliance.  PSYCHOSOCIAL ASSESSMENT:  PHQ-0. Pt exhibits positive coping skills, hopeful outlook with supportive family. No psychosocial needs identified at this time, no psychosocial interventions necessary.    Pt enjoys  Doing work with his Emergency planning/management officer, Biochemist, clinical, yard work, Dentist.  Pt goals for cardiac rehab are to increase his strength and stability. Pt had staggering gait on treadmill so was moved to walking track.  Will continue to monitor. .   Pt oriented to exercise equipment and routine.    Understanding verbalized.    Dr. Fransico Him is Medical Director for Cardiac Rehab at  [facility].

## 2016-04-29 ENCOUNTER — Encounter (HOSPITAL_COMMUNITY)
Admission: RE | Admit: 2016-04-29 | Discharge: 2016-04-29 | Disposition: A | Discharge status: Home / Self Care | Payer: Medicare Other | Source: Ambulatory Visit | Attending: Cardiovascular Disease | Admitting: Cardiovascular Disease

## 2016-04-29 ENCOUNTER — Other Ambulatory Visit (INDEPENDENT_AMBULATORY_CARE_PROVIDER_SITE_OTHER): Payer: Medicare Other | Admitting: *Deleted

## 2016-04-29 DIAGNOSIS — Z9861 Coronary angioplasty status: Secondary | ICD-10-CM | POA: Diagnosis not present

## 2016-04-29 DIAGNOSIS — I2510 Atherosclerotic heart disease of native coronary artery without angina pectoris: Secondary | ICD-10-CM | POA: Diagnosis not present

## 2016-04-29 DIAGNOSIS — Z79899 Other long term (current) drug therapy: Secondary | ICD-10-CM

## 2016-04-29 DIAGNOSIS — I214 Non-ST elevation (NSTEMI) myocardial infarction: Secondary | ICD-10-CM | POA: Diagnosis not present

## 2016-04-29 DIAGNOSIS — E785 Hyperlipidemia, unspecified: Secondary | ICD-10-CM | POA: Diagnosis not present

## 2016-04-29 LAB — COMPREHENSIVE METABOLIC PANEL
ALK PHOS: 60 U/L (ref 40–115)
ALT: 36 U/L (ref 9–46)
AST: 38 U/L — ABNORMAL HIGH (ref 10–35)
Albumin: 3.6 g/dL (ref 3.6–5.1)
BILIRUBIN TOTAL: 0.8 mg/dL (ref 0.2–1.2)
BUN: 13 mg/dL (ref 7–25)
CO2: 23 mmol/L (ref 20–31)
Calcium: 8.6 mg/dL (ref 8.6–10.3)
Chloride: 105 mmol/L (ref 98–110)
Creat: 0.83 mg/dL (ref 0.70–1.18)
GLUCOSE: 99 mg/dL (ref 65–99)
POTASSIUM: 4.3 mmol/L (ref 3.5–5.3)
Sodium: 136 mmol/L (ref 135–146)
Total Protein: 6.4 g/dL (ref 6.1–8.1)

## 2016-04-29 LAB — LIPID PANEL
Cholesterol: 113 mg/dL — ABNORMAL LOW (ref 125–200)
HDL: 22 mg/dL — ABNORMAL LOW (ref >=40)
LDL CALC: 63 mg/dL (ref <130)
TRIGLYCERIDES: 139 mg/dL (ref <150)
Total CHOL/HDL Ratio: 5.1 Ratio — ABNORMAL HIGH (ref <=5.0)
VLDL: 28 mg/dL (ref <30)

## 2016-04-29 NOTE — Progress Notes (Signed)
QUALITY OF LIFE SCORE REVIEW  Pt completed Quality of Life survey as a participant in Cardiac Rehab. Scores 21.0 or below are considered low. Patient quality of life slightly altered by physical constraints which limits ability to perform as prior to recent cardiac illness.  Overall pt does not admit to any symptoms or concerns.  Offered emotional support and reassurance.  Will continue to monitor and intervene as necessary.

## 2016-04-29 NOTE — Addendum Note (Signed)
Addended by: Eulis Foster on: 04/29/2016 10:04 AM   Modules accepted: Orders

## 2016-05-02 ENCOUNTER — Encounter (HOSPITAL_COMMUNITY)
Admission: RE | Admit: 2016-05-02 | Discharge: 2016-05-02 | Disposition: A | Discharge status: Home / Self Care | Payer: Medicare Other | Source: Ambulatory Visit | Attending: Cardiovascular Disease | Admitting: Cardiovascular Disease

## 2016-05-02 DIAGNOSIS — I214 Non-ST elevation (NSTEMI) myocardial infarction: Secondary | ICD-10-CM | POA: Diagnosis not present

## 2016-05-04 ENCOUNTER — Encounter (HOSPITAL_COMMUNITY)
Admission: RE | Admit: 2016-05-04 | Discharge: 2016-05-04 | Disposition: A | Discharge status: Home / Self Care | Payer: Medicare Other | Source: Ambulatory Visit | Attending: Cardiovascular Disease | Admitting: Cardiovascular Disease

## 2016-05-04 DIAGNOSIS — I214 Non-ST elevation (NSTEMI) myocardial infarction: Secondary | ICD-10-CM | POA: Diagnosis not present

## 2016-05-04 NOTE — Progress Notes (Signed)
Cardiac Individual Treatment Plan  Patient Details  Name: Frank Ayala MRN: [id number] Date of Birth: [birth year] Referring Provider:        CARDIAC REHAB PHASE II ORIENTATION from 04/19/2016 in [place]   Referring Provider  [name], MD      Initial Encounter Date:       CARDIAC REHAB PHASE II ORIENTATION from 04/19/2016 in [place]   Date  04/19/16   Referring Provider  [name], MD      Visit Diagnosis: NSTEMI (non-ST elevated myocardial infarction) [facility])  Patient's Home Medications on Admission:  Current outpatient prescriptions:  .  albuterol (PROVENTIL) (2.5 MG/3ML) 0.083% nebulizer solution, Take 2.5 mg by nebulization every 6 (six) hours as needed for wheezing or shortness of breath., Disp: , Rfl:  .  atorvastatin (LIPITOR) 80 MG tablet, Take 1 tablet (80 mg total) by mouth daily at 6 PM., Disp: 30 tablet, Rfl: 5 .  clopidogrel (PLAVIX) 75 MG tablet, Take 1 tablet (75 mg total) by mouth daily., Disp: 30 tablet, Rfl: 11 .  Fluticasone-Salmeterol (ADVAIR) 250-50 MCG/DOSE AEPB, Inhale 1 puff into the lungs 2 (two) times daily as needed (shortness of breath). Reported on 04/19/2016, Disp: , Rfl:  .  lisinopril (PRINIVIL,ZESTRIL) 2.5 MG tablet, Take 1 tablet (2.5 mg total) by mouth daily., Disp: 30 tablet, Rfl: 5 .  metoprolol succinate (TOPROL-XL) 25 MG 24 hr tablet, Take 1 tablet (25 mg total) by mouth daily., Disp: 30 tablet, Rfl: 5 .  Testosterone (FORTESTA) 10 MG/ACT (2%) GEL, Place 40 mg onto the skin every evening. 4 pumps, Disp: , Rfl:   Past Medical History: Past Medical History  Diagnosis Date  . COPD (chronic obstructive pulmonary disease) ([place])   . Asthma   . Hyperlipidemia   . CAD in native artery     a. NSTEMI 02/2016 with occ mLAD & occ small PDA both with collaterals, treated medically  . Ischemic cardiomyopathy     a. 02/2016: EF 45-50% by cath, 40-45% by echo.  .  Pancreatic lesion     Tobacco Use: History  Smoking status  . Never Smoker   Smokeless tobacco  . Not on file    Labs:     Recent Review Flowsheet Data    Labs for ITP Cardiac and Pulmonary Rehab Latest Ref Rng 03/07/2016 03/08/2016 04/29/2016   Cholestrol 125 - 200 mg/dL - 182 113(L)   LDLCALC <130 mg/dL - 132(H) 63   HDL >=40 mg/dL - 25(L) 22(L)   Trlycerides <150 mg/dL - 127 139   Hemoglobin A1c 4.8 - 5.6 % 6.0(H) - -      Capillary Blood Glucose: Lab Results  Component Value Date   GLUCAP 74 03/09/2016   GLUCAP 96 03/09/2016   GLUCAP 107* 03/08/2016   GLUCAP 130* 03/07/2016     Exercise Target Goals:    Exercise Program Goal: Individual exercise prescription set with THRR, safety & activity barriers. Participant demonstrates ability to understand and report RPE using BORG scale, to self-measure pulse accurately, and to acknowledge the importance of the exercise prescription.  Exercise Prescription Goal: Starting with aerobic activity 30 plus minutes a day, 3 days per week for initial exercise prescription. Provide home exercise prescription and guidelines that participant acknowledges understanding prior to discharge.  Activity Barriers & Risk Stratification:     Activity Barriers & Cardiac Risk Stratification - 04/19/16 1402    Activity Barriers & Cardiac Risk Stratification  Activity Barriers Neck/Spine Problems   Cardiac Risk Stratification Moderate      6 Minute Walk:     6 Minute Walk      04/19/16 1629       6 Minute Walk   Phase Initial     Distance 1617 feet     Walk Time 6 minutes     # of Rest Breaks 0     MPH 3.06     METS 3.33     RPE 11     VO2 Peak 11.64     Symptoms No     Resting HR 92 bpm     Resting BP 134/64 mmHg     Max Ex. HR 123 bpm     Max Ex. BP 144/62 mmHg     2 Minute Post BP 128/68 mmHg        Initial Exercise Prescription:     Initial Exercise Prescription - 04/20/16 0700    Date of Initial Exercise RX and  Referring Provider   Date 04/19/16   Referring Provider [name], MD   Treadmill   MPH 2.2   Grade 1   Minutes 10   METs 2.99   Bike   Level 0.9   Minutes 10   METs 2.79   NuStep   Level 2   Minutes 10   METs 2.5   Prescription Details   Frequency (times per week) 3   Duration Progress to 30 minutes of continuous aerobic without signs/symptoms of physical distress   Intensity   THRR 40-80% of Max Heartrate 58-117   Ratings of Perceived Exertion 11-13   Perceived Dyspnea 0-4   Progression   Progression Continue to progress workloads to maintain intensity without signs/symptoms of physical distress.   Resistance Training   Training Prescription Yes   Weight 2 lb.   Reps 10-12      Perform Capillary Blood Glucose checks as needed.  Exercise Prescription Changes:      Exercise Prescription Changes      05/05/16 1600 05/11/16 1600         Exercise Review   Progression Yes Yes      Response to Exercise   Blood Pressure (Admit) 110/64 mmHg 124/60 mmHg      Blood Pressure (Exercise) 128/58 mmHg 142/60 mmHg      Blood Pressure (Exit) 108/64 mmHg 114/54 mmHg      Heart Rate (Admit) 79 bpm 82 bpm      Heart Rate (Exercise) 137 bpm 136 bpm      Heart Rate (Exit) 86 bpm 86 bpm      Rating of Perceived Exertion (Exercise) 12 12      Duration Progress to 30 minutes of continuous aerobic without signs/symptoms of physical distress Progress to 30 minutes of continuous aerobic without signs/symptoms of physical distress      Intensity THRR unchanged THRR unchanged      Progression   Progression Continue to progress workloads to maintain intensity without signs/symptoms of physical distress. Continue to progress workloads to maintain intensity without signs/symptoms of physical distress.      Average METs 3.13 5.3      Resistance Training   Training Prescription Yes Yes      Weight 2 lb. 2 lb.      Reps 10-12 10-12      Treadmill   MPH 2.3 2.3      Grade 0 0       Minutes 10 10  METs 2.76 2.76      Bike   Level 0.9 2      Minutes 10 10      METs 2.79 2.83      NuStep   Level 4 4      Minutes 10 10      METs 3.5 4.1      Home Exercise Plan   Plans to continue exercise at  Home  reviewed on 7/19/17l see progress note      Frequency  Add 2 additional days to program exercise sessions.         Exercise Comments:      Exercise Comments      05/05/16 1622           Exercise Comments Pt is tolerating exercises very well, will continue to monitor exercise progression          Discharge Exercise Prescription (Final Exercise Prescription Changes):     Exercise Prescription Changes - 05/11/16 1600    Exercise Review   Progression Yes   Response to Exercise   Blood Pressure (Admit) 124/60 mmHg   Blood Pressure (Exercise) 142/60 mmHg   Blood Pressure (Exit) 114/54 mmHg   Heart Rate (Admit) 82 bpm   Heart Rate (Exercise) 136 bpm   Heart Rate (Exit) 86 bpm   Rating of Perceived Exertion (Exercise) 12   Duration Progress to 30 minutes of continuous aerobic without signs/symptoms of physical distress   Intensity THRR unchanged   Progression   Progression Continue to progress workloads to maintain intensity without signs/symptoms of physical distress.   Average METs 5.3   Resistance Training   Training Prescription Yes   Weight 2 lb.   Reps 10-12   Treadmill   MPH 2.3   Grade 0   Minutes 10   METs 2.76   Bike   Level 2   Minutes 10   METs 2.83   NuStep   Level 4   Minutes 10   METs 4.1   Home Exercise Plan   Plans to continue exercise at Home  reviewed on 7/19/17l see progress note   Frequency Add 2 additional days to program exercise sessions.      Nutrition:  Target Goals: Understanding of nutrition guidelines, daily intake of sodium 1500mg , cholesterol 200mg , calories 30% from fat and 7% or less from saturated fats, daily to have 5 or more servings of fruits and vegetables.  Biometrics:     Pre  Biometrics - 04/19/16 1627    Pre Biometrics   Height 5' 8.25" (1.734 m)   Weight 212 lb 4.9 oz (96.3 kg)   Waist Circumference 40.5 inches   Hip Circumference 44.5 inches   Waist to Hip Ratio 0.91 %   BMI (Calculated) 32.1   Triceps Skinfold 21 mm   % Body Fat 30.9 %   Grip Strength 42 kg   Flexibility 7 in   Single Leg Stand 7.59 seconds       Nutrition Therapy Plan and Nutrition Goals:     Nutrition Therapy & Goals - 05/02/16 1434    Nutrition Therapy   Diet Therapeutic Lifestyle Changes   Intervention Plan   Intervention Prescribe, educate and counsel regarding individualized specific dietary modifications aiming towards targeted core components such as weight, hypertension, lipid management, diabetes, heart failure and other comorbidities.   Expected Outcomes Short Term Goal: Understand basic principles of dietary content, such as calories, fat, sodium, cholesterol and nutrients.;Long Term Goal: Adherence to prescribed nutrition  plan.      Nutrition Discharge: Nutrition Scores:     Nutrition Assessments - 05/02/16 1520    MEDFICTS Scores   Pre Score 3      Nutrition Goals Re-Evaluation:   Psychosocial: Target Goals: Acknowledge presence or absence of depression, maximize coping skills, provide positive support system. Participant is able to verbalize types and ability to use techniques and skills needed for reducing stress and depression.  Initial Review & Psychosocial Screening:     Initial Psych Review & Screening - 04/27/16 0933    Family Dynamics   Good Support System? Yes   Barriers   Psychosocial barriers to participate in program There are no identifiable barriers or psychosocial needs.;The patient should benefit from training in stress management and relaxation.   Screening Interventions   Interventions Encouraged to exercise      Quality of Life Scores:     Quality of Life - 04/27/16 1309    Quality of Life Scores   Health/Function Pre 26.4  %   Socioeconomic Pre 28.75 %   Psych/Spiritual Pre 28.29 %   Family Pre 30 %   GLOBAL Pre 27.77 %      PHQ-9:     Recent Review Flowsheet Data    Depression screen [facility] 2/9 04/27/2016   Decreased Interest 0   Down, Depressed, Hopeless 0   PHQ - 2 Score 0      Psychosocial Evaluation and Intervention:     Psychosocial Evaluation - 05/04/16 1728    Psychosocial Evaluation & Interventions   Interventions Stress management education;Relaxation education;Encouraged to exercise with the program and follow exercise prescription   Continued Psychosocial Services Needed Yes      Psychosocial Re-Evaluation:   Vocational Rehabilitation: Provide vocational rehab assistance to qualifying candidates.   Vocational Rehab Evaluation & Intervention:   Education: Education Goals: Education classes will be provided on a weekly basis, covering required topics. Participant will state understanding/return demonstration of topics presented.  Learning Barriers/Preferences:   Education Topics: Count Your Pulse:  -Group instruction provided by verbal instruction, demonstration, patient participation and written materials to support subject.  Instructors address importance of being able to find your pulse and how to count your pulse when at home without a heart monitor.  Patients get hands on experience counting their pulse with staff help and individually.      CARDIAC REHAB PHASE II EXERCISE from 05/04/2016 in [place]   Date  04/29/16   Educator  Maurice Small, RN   Instruction Review Code  2- meets goals/outcomes      Heart Attack, Angina, and Risk Factor Modification:  -Group instruction provided by verbal instruction, video, and written materials to support subject.  Instructors address signs and symptoms of angina and heart attacks.    Also discuss risk factors for heart disease and how to make changes to improve heart health risk  factors.   Functional Fitness:  -Group instruction provided by verbal instruction, demonstration, patient participation, and written materials to support subject.  Instructors address safety measures for doing things around the house.  Discuss how to get up and down off the floor, how to pick things up properly, how to safely get out of a chair without assistance, and balance training.   Meditation and Mindfulness:  -Group instruction provided by verbal instruction, patient participation, and written materials to support subject.  Instructor addresses importance of mindfulness and meditation practice to help reduce stress and improve awareness.  Instructor also  leads participants through a meditation exercise.       CARDIAC REHAB PHASE II EXERCISE from 05/04/2016 in [place]   Date  04/27/16   Instruction Review Code  2- meets goals/outcomes      Stretching for Flexibility and Mobility:  -Group instruction provided by verbal instruction, patient participation, and written materials to support subject.  Instructors lead participants through series of stretches that are designed to increase flexibility thus improving mobility.  These stretches are additional exercise for major muscle groups that are typically performed during regular warm up and cool down.   Hands Only CPR Anytime:  -Group instruction provided by verbal instruction, video, patient participation and written materials to support subject.  Instructors co-teach with AHA video for hands only CPR.  Participants get hands on experience with mannequins.   Nutrition I class: Heart Healthy Eating:  -Group instruction provided by PowerPoint slides, verbal discussion, and written materials to support subject matter. The instructor gives an explanation and review of the Therapeutic Lifestyle Changes diet recommendations, which includes a discussion on lipid goals, dietary fat, sodium, fiber, plant  stanol/sterol esters, sugar, and the components of a well-balanced, healthy diet.   Nutrition II class: Lifestyle Skills:  -Group instruction provided by PowerPoint slides, verbal discussion, and written materials to support subject matter. The instructor gives an explanation and review of label reading, grocery shopping for heart health, heart healthy recipe modifications, and ways to make healthier choices when eating out.   Diabetes Question & Answer:  -Group instruction provided by PowerPoint slides, verbal discussion, and written materials to support subject matter. The instructor gives an explanation and review of diabetes co-morbidities, pre- and post-prandial blood glucose goals, pre-exercise blood glucose goals, signs, symptoms, and treatment of hypoglycemia and hyperglycemia, and foot care basics.   Diabetes Blitz:  -Group instruction provided by PowerPoint slides, verbal discussion, and written materials to support subject matter. The instructor gives an explanation and review of the physiology behind type 1 and type 2 diabetes, diabetes medications and rational behind using different medications, pre- and post-prandial blood glucose recommendations and Hemoglobin A1c goals, diabetes diet, and exercise including blood glucose guidelines for exercising safely.    Portion Distortion:  -Group instruction provided by PowerPoint slides, verbal discussion, written materials, and food models to support subject matter. The instructor gives an explanation of serving size versus portion size, changes in portions sizes over the last 20 years, and what consists of a serving from each food group.   Stress Management:  -Group instruction provided by verbal instruction, video, and written materials to support subject matter.  Instructors review role of stress in heart disease and how to cope with stress positively.     Exercising on Your Own:  -Group instruction provided by verbal instruction,  power point, and written materials to support subject.  Instructors discuss benefits of exercise, components of exercise, frequency and intensity of exercise, and end points for exercise.  Also discuss use of nitroglycerin and activating EMS.  Review options of places to exercise outside of rehab.  Review guidelines for sex with heart disease.   Cardiac Drugs I:  -Group instruction provided by verbal instruction and written materials to support subject.  Instructor reviews cardiac drug classes: antiplatelets, anticoagulants, beta blockers, and statins.  Instructor discusses reasons, side effects, and lifestyle considerations for each drug class.   Cardiac Drugs II:  -Group instruction provided by verbal instruction and written materials to support subject.  Instructor reviews cardiac drug  classes: angiotensin converting enzyme inhibitors (ACE-I), angiotensin II receptor blockers (ARBs), nitrates, and calcium channel blockers.  Instructor discusses reasons, side effects, and lifestyle considerations for each drug class.          CARDIAC REHAB PHASE II EXERCISE from 05/04/2016 in [place]   Date  05/04/16   Instruction Review Code  2- meets goals/outcomes      Anatomy and Physiology of the Circulatory System:  -Group instruction provided by verbal instruction, video, and written materials to support subject.  Reviews functional anatomy of heart, how it relates to various diagnoses, and what role the heart plays in the overall system.   Knowledge Questionnaire Score:     Knowledge Questionnaire Score - 04/29/16 1149    Knowledge Questionnaire Score   Pre Score 23/24      Core Components/Risk Factors/Patient Goals at Admission:     Personal Goals and Risk Factors at Admission - 04/19/16 1630    Core Components/Risk Factors/Patient Goals on Admission    Weight Management Obesity   Increase Strength and Stamina Yes   Intervention Provide advice,  education, support and counseling about physical activity/exercise needs.;Develop an individualized exercise prescription for aerobic and resistive training based on initial evaluation findings, risk stratification, comorbidities and participant's personal goals.   Expected Outcomes Achievement of increased cardiorespiratory fitness and enhanced flexibility, muscular endurance and strength shown through measurements of functional capacity and personal statement of participant.   Lipids Yes   Intervention Provide education and support for participant on nutrition & aerobic/resistive exercise along with prescribed medications to achieve LDL 70mg , HDL >40mg .   Expected Outcomes Short Term: Participant states understanding of desired cholesterol values and is compliant with medications prescribed. Participant is following exercise prescription and nutrition guidelines.;Long Term: Cholesterol controlled with medications as prescribed, with individualized exercise RX and with personalized nutrition plan. Value goals: LDL < 70mg , HDL > 40 mg.      Core Components/Risk Factors/Patient Goals Review:    Core Components/Risk Factors/Patient Goals at Discharge (Final Review):    ITP Comments:     ITP Comments      04/19/16 1349           ITP Comments Medical Director - Dr. Fransico Him, MD          Comments: Pt is making expected progress toward personal goals after completing    sessions. Recommend continued exercise and life style modification education including  stress management and relaxation techniques to decrease cardiac risk profile.

## 2016-05-06 ENCOUNTER — Encounter (HOSPITAL_COMMUNITY)
Admission: RE | Admit: 2016-05-06 | Discharge: 2016-05-06 | Disposition: A | Discharge status: Home / Self Care | Payer: Medicare Other | Source: Ambulatory Visit | Attending: Cardiovascular Disease | Admitting: Cardiovascular Disease

## 2016-05-06 DIAGNOSIS — I214 Non-ST elevation (NSTEMI) myocardial infarction: Secondary | ICD-10-CM

## 2016-05-06 NOTE — Progress Notes (Signed)
Reviewed home exercise with pt today.  Pt plans to walk for exercise on Tues./Thurs./Sat. Reviewed THR, pulse, RPE, sign and symptoms, and when to call 911 or MD.  Also discussed weather considerations and indoor options.  Pt voiced understanding.      Frank Ayala [employer]

## 2016-05-09 ENCOUNTER — Encounter (HOSPITAL_COMMUNITY)
Admission: RE | Admit: 2016-05-09 | Discharge: 2016-05-09 | Disposition: A | Discharge status: Home / Self Care | Payer: Medicare Other | Source: Ambulatory Visit | Attending: Cardiovascular Disease | Admitting: Cardiovascular Disease

## 2016-05-09 DIAGNOSIS — I214 Non-ST elevation (NSTEMI) myocardial infarction: Secondary | ICD-10-CM

## 2016-05-11 ENCOUNTER — Encounter (HOSPITAL_COMMUNITY)
Admission: RE | Admit: 2016-05-11 | Discharge: 2016-05-11 | Disposition: A | Discharge status: Home / Self Care | Payer: Medicare Other | Source: Ambulatory Visit | Attending: Cardiovascular Disease | Admitting: Cardiovascular Disease

## 2016-05-11 DIAGNOSIS — I214 Non-ST elevation (NSTEMI) myocardial infarction: Secondary | ICD-10-CM | POA: Diagnosis not present

## 2016-05-13 ENCOUNTER — Encounter (HOSPITAL_COMMUNITY)
Admission: RE | Admit: 2016-05-13 | Discharge: 2016-05-13 | Disposition: A | Discharge status: Home / Self Care | Payer: Medicare Other | Source: Ambulatory Visit | Attending: Cardiovascular Disease | Admitting: Cardiovascular Disease

## 2016-05-13 DIAGNOSIS — I214 Non-ST elevation (NSTEMI) myocardial infarction: Secondary | ICD-10-CM

## 2016-05-16 ENCOUNTER — Encounter (HOSPITAL_COMMUNITY)
Admission: RE | Admit: 2016-05-16 | Discharge: 2016-05-16 | Disposition: A | Discharge status: Home / Self Care | Payer: Medicare Other | Source: Ambulatory Visit | Attending: Cardiovascular Disease | Admitting: Cardiovascular Disease

## 2016-05-16 DIAGNOSIS — I214 Non-ST elevation (NSTEMI) myocardial infarction: Secondary | ICD-10-CM | POA: Diagnosis not present

## 2016-05-18 ENCOUNTER — Encounter (HOSPITAL_COMMUNITY)
Admission: RE | Admit: 2016-05-18 | Discharge: 2016-05-18 | Disposition: A | Discharge status: Home / Self Care | Payer: Medicare Other | Source: Ambulatory Visit | Attending: Cardiovascular Disease | Admitting: Cardiovascular Disease

## 2016-05-18 DIAGNOSIS — I214 Non-ST elevation (NSTEMI) myocardial infarction: Secondary | ICD-10-CM | POA: Diagnosis not present

## 2016-05-20 ENCOUNTER — Encounter (HOSPITAL_COMMUNITY)
Admission: RE | Admit: 2016-05-20 | Discharge: 2016-05-20 | Disposition: A | Discharge status: Home / Self Care | Payer: Medicare Other | Source: Ambulatory Visit | Attending: Cardiovascular Disease | Admitting: Cardiovascular Disease

## 2016-05-20 DIAGNOSIS — I214 Non-ST elevation (NSTEMI) myocardial infarction: Secondary | ICD-10-CM

## 2016-05-23 ENCOUNTER — Encounter (HOSPITAL_COMMUNITY): Payer: Medicare Other

## 2016-05-25 ENCOUNTER — Encounter (HOSPITAL_COMMUNITY)
Admission: RE | Admit: 2016-05-25 | Discharge: 2016-05-25 | Disposition: A | Discharge status: Home / Self Care | Payer: Medicare Other | Source: Ambulatory Visit | Attending: Cardiovascular Disease | Admitting: Cardiovascular Disease

## 2016-05-25 DIAGNOSIS — I214 Non-ST elevation (NSTEMI) myocardial infarction: Secondary | ICD-10-CM | POA: Diagnosis present

## 2016-05-25 DIAGNOSIS — Z955 Presence of coronary angioplasty implant and graft: Secondary | ICD-10-CM | POA: Diagnosis present

## 2016-05-27 ENCOUNTER — Encounter (HOSPITAL_COMMUNITY): Payer: Medicare Other

## 2016-05-30 ENCOUNTER — Encounter (HOSPITAL_COMMUNITY)
Admission: RE | Admit: 2016-05-30 | Discharge: 2016-05-30 | Disposition: A | Discharge status: Home / Self Care | Payer: Medicare Other | Source: Ambulatory Visit | Attending: Cardiovascular Disease | Admitting: Cardiovascular Disease

## 2016-05-30 DIAGNOSIS — I214 Non-ST elevation (NSTEMI) myocardial infarction: Secondary | ICD-10-CM

## 2016-05-30 NOTE — Progress Notes (Signed)
Frank Ayala 75 y.o. male Nutrition Note Spoke with pt. Pt reports losing 20 lb since his procedure. Pt wt today 94 kg, which is down 2.3 kg since admission. Pt states he has lost wt by changing his food choices and portion sizes consumed. Nutrition Survey reviewed with pt. Pt is following Step 2 of the Therapeutic Lifestyle Changes diet. Pt is aware of pre-diabetes dx. Pre-diabetes discussed. Pt expressed understanding of the information reviewed. Pt aware of nutrition education classes offered. Lab Results  Component Value Date   HGBA1C 6.0 (H) 03/07/2016   Wt Readings from Last 3 Encounters:  04/19/16 212 lb 4.9 oz (96.3 kg)  03/18/16 215 lb 12.8 oz (97.9 kg)  03/10/16 213 lb 1.6 oz (96.7 kg)   Nutrition Diagnosis ? Food-and nutrition-related knowledge deficit related to lack of exposure to information as related to diagnosis of: ? CVD ? Pre-DM ? Obesity related to excessive energy intake as evidenced by a BMI of 32.1 Nutrition Intervention ? Benefits of adopting Therapeutic Lifestyle Changes discussed when Med[employer]icts reviewed. ? Pt to attend the Portion Distortion class ? Pt to attend the  ? Nutrition I class                        ? Nutrition II class ? Pt given handouts for: ? Nutrition I class ? Nutrition II class ? Pre-diabetes  ? Continue client-centered nutrition education by RD, as part of interdisciplinary care.  Goal(s) ? Pt to identify food quantities necessary to achieve weight loss of 6-24 lb (2.7-10.9 kg) at graduation from cardiac rehab.  ? Pt to describe the benefit of including fruits, vegetables, whole grains, and low-fat dairy products in a heart healthy meal plan.  Monitor and Evaluate progress toward nutrition goal with team.  Derek Mound, M.Ed, RD, LDN, CDE 05/30/2016 9:20 AM

## 2016-06-01 ENCOUNTER — Encounter (HOSPITAL_COMMUNITY)
Admission: RE | Admit: 2016-06-01 | Discharge: 2016-06-01 | Disposition: A | Discharge status: Home / Self Care | Payer: Medicare Other | Source: Ambulatory Visit | Attending: Cardiovascular Disease | Admitting: Cardiovascular Disease

## 2016-06-01 DIAGNOSIS — I214 Non-ST elevation (NSTEMI) myocardial infarction: Secondary | ICD-10-CM | POA: Diagnosis not present

## 2016-06-03 ENCOUNTER — Encounter (HOSPITAL_COMMUNITY)
Admission: RE | Admit: 2016-06-03 | Discharge: 2016-06-03 | Disposition: A | Discharge status: Home / Self Care | Payer: Medicare Other | Source: Ambulatory Visit | Attending: Cardiovascular Disease | Admitting: Cardiovascular Disease

## 2016-06-03 DIAGNOSIS — I214 Non-ST elevation (NSTEMI) myocardial infarction: Secondary | ICD-10-CM

## 2016-06-06 ENCOUNTER — Encounter (HOSPITAL_COMMUNITY)
Admission: RE | Admit: 2016-06-06 | Discharge: 2016-06-06 | Disposition: A | Discharge status: Home / Self Care | Payer: Medicare Other | Source: Ambulatory Visit | Attending: Cardiovascular Disease | Admitting: Cardiovascular Disease

## 2016-06-06 DIAGNOSIS — I214 Non-ST elevation (NSTEMI) myocardial infarction: Secondary | ICD-10-CM | POA: Diagnosis not present

## 2016-06-07 NOTE — Progress Notes (Signed)
Cardiac Individual Treatment Plan  Patient Details  Name: Frank Ayala MRN: [id number] Date of Birth: May 02, 1941 Referring Provider:   Flowsheet Row CARDIAC REHAB PHASE II ORIENTATION from 04/19/2016 in [place]  Referring Provider  [name], MD      Initial Encounter Date:  [place] PHASE II ORIENTATION from 04/19/2016 in [place]  Date  04/19/16  Referring Provider  [name], MD      Visit Diagnosis: NSTEMI (non-ST elevated myocardial infarction) [facility])  Patient's Home Medications on Admission:  Current Outpatient Prescriptions:  .  albuterol (PROVENTIL) (2.5 MG/3ML) 0.083% nebulizer solution, Take 2.5 mg by nebulization every 6 (six) hours as needed for wheezing or shortness of breath., Disp: , Rfl:  .  atorvastatin (LIPITOR) 80 MG tablet, Take 1 tablet (80 mg total) by mouth daily at 6 PM., Disp: 30 tablet, Rfl: 5 .  clopidogrel (PLAVIX) 75 MG tablet, Take 1 tablet (75 mg total) by mouth daily., Disp: 30 tablet, Rfl: 11 .  Fluticasone-Salmeterol (ADVAIR) 250-50 MCG/DOSE AEPB, Inhale 1 puff into the lungs 2 (two) times daily as needed (shortness of breath). Reported on 04/19/2016, Disp: , Rfl:  .  lisinopril (PRINIVIL,ZESTRIL) 2.5 MG tablet, Take 1 tablet (2.5 mg total) by mouth daily., Disp: 30 tablet, Rfl: 5 .  metoprolol succinate (TOPROL-XL) 25 MG 24 hr tablet, Take 1 tablet (25 mg total) by mouth daily., Disp: 30 tablet, Rfl: 5 .  Testosterone (FORTESTA) 10 MG/ACT (2%) GEL, Place 40 mg onto the skin every evening. 4 pumps, Disp: , Rfl:   Past Medical History: Past Medical History:  Diagnosis Date  . Asthma   . CAD in native artery    a. NSTEMI 02/2016 with occ mLAD & occ small PDA both with collaterals, treated medically  . COPD (chronic obstructive pulmonary disease) ([place])   . Hyperlipidemia   . Ischemic cardiomyopathy    a. 02/2016: EF 45-50% by cath, 40-45%  by echo.  . Pancreatic lesion     Tobacco Use: History  Smoking Status  . Never Smoker  Smokeless Tobacco  . Never Used    Labs: Recent Review Flowsheet Data    Labs for ITP Cardiac and Pulmonary Rehab Latest Ref Rng & Units 03/07/2016 03/08/2016 04/29/2016   Cholestrol 125 - 200 mg/dL - 182 113(L)   LDLCALC <130 mg/dL - 132(H) 63   HDL >=40 mg/dL - 25(L) 22(L)   Trlycerides <150 mg/dL - 127 139   Hemoglobin A1c 4.8 - 5.6 % 6.0(H) - -      Capillary Blood Glucose: Lab Results  Component Value Date   GLUCAP 74 03/09/2016   GLUCAP 96 03/09/2016   GLUCAP 107 (H) 03/08/2016   GLUCAP 130 (H) 03/07/2016     Exercise Target Goals:    Exercise Program Goal: Individual exercise prescription set with THRR, safety & activity barriers. Participant demonstrates ability to understand and report RPE using BORG scale, to self-measure pulse accurately, and to acknowledge the importance of the exercise prescription.  Exercise Prescription Goal: Starting with aerobic activity 30 plus minutes a day, 3 days per week for initial exercise prescription. Provide home exercise prescription and guidelines that participant acknowledges understanding prior to discharge.  Activity Barriers & Risk Stratification:     Activity Barriers & Cardiac Risk Stratification - 04/19/16 1402      Activity Barriers & Cardiac Risk Stratification   Activity Barriers Neck/Spine Problems   Cardiac Risk Stratification Moderate  6 Minute Walk:     6 Minute Walk    Row Name 04/19/16 1629         6 Minute Walk   Phase Initial     Distance 1617 feet     Walk Time 6 minutes     # of Rest Breaks 0     MPH 3.06     METS 3.33     RPE 11     VO2 Peak 11.64     Symptoms No     Resting HR 92 bpm     Resting BP 134/64     Max Ex. HR 123 bpm     Max Ex. BP 144/62     2 Minute Post BP 128/68        Initial Exercise Prescription:     Initial Exercise Prescription - 04/20/16 0700      Date of  Initial Exercise RX and Referring Provider   Date 04/19/16   Referring Provider [name], MD     Treadmill   MPH 2.2   Grade 1   Minutes 10   METs 2.99     Bike   Level 0.9   Minutes 10   METs 2.79     NuStep   Level 2   Minutes 10   METs 2.5     Prescription Details   Frequency (times per week) 3   Duration Progress to 30 minutes of continuous aerobic without signs/symptoms of physical distress     Intensity   THRR 40-80% of Max Heartrate 58-117   Ratings of Perceived Exertion 11-13   Perceived Dyspnea 0-4     Progression   Progression Continue to progress workloads to maintain intensity without signs/symptoms of physical distress.     Resistance Training   Training Prescription Yes   Weight 2 lb.   Reps 10-12      Perform Capillary Blood Glucose checks as needed.  Exercise Prescription Changes:      Exercise Prescription Changes    Row Name 05/05/16 1600 05/11/16 1600 05/18/16 1600 06/03/16 1600       Exercise Review   Progression Yes Yes Yes Yes      Response to Exercise   Blood Pressure (Admit) 110/64 124/60 118/70 142/80    Blood Pressure (Exercise) 128/58 142/60 166/82 160/80    Blood Pressure (Exit) 108/64 114/54 122/72 118/62    Heart Rate (Admit) 79 bpm 82 bpm 71 bpm 82 bpm    Heart Rate (Exercise) 137 bpm 136 bpm 127 bpm 134 bpm    Heart Rate (Exit) 86 bpm 86 bpm 82 bpm 93 bpm    Rating of Perceived Exertion (Exercise) 12 12 12 12     Duration Progress to 30 minutes of continuous aerobic without signs/symptoms of physical distress Progress to 30 minutes of continuous aerobic without signs/symptoms of physical distress Progress to 30 minutes of continuous aerobic without signs/symptoms of physical distress Progress to 30 minutes of continuous aerobic without signs/symptoms of physical distress    Intensity THRR unchanged THRR unchanged THRR unchanged THRR unchanged      Progression   Progression Continue to progress workloads to  maintain intensity without signs/symptoms of physical distress. Continue to progress workloads to maintain intensity without signs/symptoms of physical distress. Continue to progress workloads to maintain intensity without signs/symptoms of physical distress. Continue to progress workloads to maintain intensity without signs/symptoms of physical distress.    Average METs 3.13 5.3 3.6 3.7  Resistance Training   Training Prescription Yes Yes Yes Yes    Weight 2 lb. 2 lb. 4lbs 4lbs    Reps 10-12 10-12 10-12 10-12      Treadmill   MPH 2.3 2.3 2.3 2.3    Grade 0 0 0 1    Minutes 10 10 10 10     METs 2.76 2.76 2.76 3.08      Bike   Level 0.9 2 1.5  prev. column was pt working harder than ex rx 1.5  prev. column was pt working harder than ex rx    Minutes 10 10 10 10     METs 2.79 2.83 4.02 4.02      NuStep   Level 4 4 4 4     Minutes 10 10 10 10     METs 3.5 4.1 4 4.2      Home Exercise Plan   Plans to continue exercise at  - Home  reviewed on 7/19/17l see progress note Home  reviewed on 7/19/17l see progress note Home  reviewed on 7/19/17l see progress note    Frequency  - Add 2 additional days to program exercise sessions. Add 2 additional days to program exercise sessions. Add 2 additional days to program exercise sessions.       Exercise Comments:      Exercise Comments    Row Name 05/05/16 1622 06/03/16 1628         Exercise Comments Pt is tolerating exercises very well, will continue to monitor exercise progression Reviewed METs and goals. Pt is tolerating exercises very well, will continue to monitor exercise progression         Discharge Exercise Prescription (Final Exercise Prescription Changes):     Exercise Prescription Changes - 06/03/16 1600      Exercise Review   Progression Yes     Response to Exercise   Blood Pressure (Admit) 142/80   Blood Pressure (Exercise) 160/80   Blood Pressure (Exit) 118/62   Heart Rate (Admit) 82 bpm   Heart Rate  (Exercise) 134 bpm   Heart Rate (Exit) 93 bpm   Rating of Perceived Exertion (Exercise) 12   Duration Progress to 30 minutes of continuous aerobic without signs/symptoms of physical distress   Intensity THRR unchanged     Progression   Progression Continue to progress workloads to maintain intensity without signs/symptoms of physical distress.   Average METs 3.7     Resistance Training   Training Prescription Yes   Weight 4lbs   Reps 10-12     Treadmill   MPH 2.3   Grade 1   Minutes 10   METs 3.08     Bike   Level 1.5  prev. column was pt working harder than ex rx   Minutes 10   METs 4.02     NuStep   Level 4   Minutes 10   METs 4.2     Home Exercise Plan   Plans to continue exercise at Home  reviewed on 7/19/17l see progress note   Frequency Add 2 additional days to program exercise sessions.      Nutrition:  Target Goals: Understanding of nutrition guidelines, daily intake of sodium 1500mg , cholesterol 200mg , calories 30% from fat and 7% or less from saturated fats, daily to have 5 or more servings of fruits and vegetables.  Biometrics:     Pre Biometrics - 04/19/16 1627      Pre Biometrics   Height 5' 8.25" (1.734 m)   Weight 212  lb 4.9 oz (96.3 kg)   Waist Circumference 40.5 inches   Hip Circumference 44.5 inches   Waist to Hip Ratio 0.91 %   BMI (Calculated) 32.1   Triceps Skinfold 21 mm   % Body Fat 30.9 %   Grip Strength 42 kg   Flexibility 7 in   Single Leg Stand 7.59 seconds       Nutrition Therapy Plan and Nutrition Goals:     Nutrition Therapy & Goals - 05/30/16 0924      Nutrition Therapy   Diet Therapeutic Lifestyle Changes     Personal Nutrition Goals   Personal Goal #1 1-2 lb wt loss per week to a goal wt loss of 6-24 lb at graduation from [place], educate and counsel regarding individualized specific dietary modifications aiming towards targeted core components such as  weight, hypertension, lipid management, diabetes, heart failure and other comorbidities.;Nutrition handout(s) given to patient.  Pre-diabetes handout given   Expected Outcomes Short Term Goal: Understand basic principles of dietary content, such as calories, fat, sodium, cholesterol and nutrients.;Long Term Goal: Adherence to prescribed nutrition plan.      Nutrition Discharge: Nutrition Scores:     Nutrition Assessments - 05/02/16 1520      MEDFICTS Scores   Pre Score 3      Nutrition Goals Re-Evaluation:   Psychosocial: Target Goals: Acknowledge presence or absence of depression, maximize coping skills, provide positive support system. Participant is able to verbalize types and ability to use techniques and skills needed for reducing stress and depression.  Initial Review & Psychosocial Screening:     Initial Psych Review & Screening - 04/27/16 0933      Family Dynamics   Good Support System? Yes     Barriers   Psychosocial barriers to participate in program There are no identifiable barriers or psychosocial needs.;The patient should benefit from training in stress management and relaxation.     Screening Interventions   Interventions Encouraged to exercise      Quality of Life Scores:     Quality of Life - 04/27/16 1309      Quality of Life Scores   Health/Function Pre 26.4 %   Socioeconomic Pre 28.75 %   Psych/Spiritual Pre 28.29 %   Family Pre 30 %   GLOBAL Pre 27.77 %      PHQ-9: Recent Review Flowsheet Data    Depression screen [facility] 2/9 04/27/2016   Decreased Interest 0   Down, Depressed, Hopeless 0   PHQ - 2 Score 0      Psychosocial Evaluation and Intervention:     Psychosocial Evaluation - 05/04/16 1728      Psychosocial Evaluation & Interventions   Interventions Stress management education;Relaxation education;Encouraged to exercise with the program and follow exercise prescription   Continued Psychosocial Services Needed Yes       Psychosocial Re-Evaluation:     Psychosocial Re-Evaluation    [place] Name 05/30/16 0914             Psychosocial Re-Evaluation   Interventions Stress management education;Relaxation education;Encouraged to attend Cardiac Rehabilitation for the exercise       Continued Psychosocial Services Needed No          Vocational Rehabilitation: Provide vocational rehab assistance to qualifying candidates.   Vocational Rehab Evaluation & Intervention:   Education: Education Goals: Education classes will be provided on a weekly basis, covering required topics. Participant will state understanding/return demonstration  of topics presented.  Learning Barriers/Preferences:   Education Topics: Count Your Pulse:  -Group instruction provided by verbal instruction, demonstration, patient participation and written materials to support subject.  Instructors address importance of being able to find your pulse and how to count your pulse when at home without a heart monitor.  Patients get hands on experience counting their pulse with staff help and individually. Flowsheet Row CARDIAC REHAB PHASE II EXERCISE from 06/08/2016 in [place]  Date  04/29/16  Educator  Maurice Small, RN  Instruction Review Code  2- meets goals/outcomes      Heart Attack, Angina, and Risk Factor Modification:  -Group instruction provided by verbal instruction, video, and written materials to support subject.  Instructors address signs and symptoms of angina and heart attacks.    Also discuss risk factors for heart disease and how to make changes to improve heart health risk factors.   Functional Fitness:  -Group instruction provided by verbal instruction, demonstration, patient participation, and written materials to support subject.  Instructors address safety measures for doing things around the house.  Discuss how to get up and down off the floor, how to pick things up properly,  how to safely get out of a chair without assistance, and balance training. Flowsheet Row CARDIAC REHAB PHASE II EXERCISE from 06/08/2016 in [place]  Date  05/13/16  Instruction Review Code  2- meets goals/outcomes      Meditation and Mindfulness:  -Group instruction provided by verbal instruction, patient participation, and written materials to support subject.  Instructor addresses importance of mindfulness and meditation practice to help reduce stress and improve awareness.  Instructor also leads participants through a meditation exercise.  Flowsheet Row CARDIAC REHAB PHASE II EXERCISE from 06/08/2016 in [place]  Date  04/27/16  Instruction Review Code  2- meets goals/outcomes      Stretching for Flexibility and Mobility:  -Group instruction provided by verbal instruction, patient participation, and written materials to support subject.  Instructors lead participants through series of stretches that are designed to increase flexibility thus improving mobility.  These stretches are additional exercise for major muscle groups that are typically performed during regular warm up and cool down. Flowsheet Row CARDIAC REHAB PHASE II EXERCISE from 06/08/2016 in [place]  Date  05/20/16  Instruction Review Code  2- meets goals/outcomes      Hands Only CPR Anytime:  -Group instruction provided by verbal instruction, video, patient participation and written materials to support subject.  Instructors co-teach with [facility] video for hands only CPR.  Participants get hands on experience with mannequins.   Nutrition I class: Heart Healthy Eating:  -Group instruction provided by PowerPoint slides, verbal discussion, and written materials to support subject matter. The instructor gives an explanation and review of the Therapeutic Lifestyle Changes diet recommendations, which includes a discussion on lipid  goals, dietary fat, sodium, fiber, plant stanol/sterol esters, sugar, and the components of a well-balanced, healthy diet. Flowsheet Row CARDIAC REHAB PHASE II EXERCISE from 06/08/2016 in [place]  Date  05/30/16  Educator  RD  Instruction Review Code  Not applicable [class handout given]      Nutrition II class: Lifestyle Skills:  -Group instruction provided by PowerPoint slides, verbal discussion, and written materials to support subject matter. The instructor gives an explanation and review of label reading, grocery shopping for heart health, heart healthy recipe  modifications, and ways to make healthier choices when eating out. Flowsheet Row CARDIAC REHAB PHASE II EXERCISE from 06/08/2016 in [place]  Date  05/30/16  Educator  RD  Instruction Review Code  Not applicable [class handout given]      Diabetes Question & Answer:  -Group instruction provided by PowerPoint slides, verbal discussion, and written materials to support subject matter. The instructor gives an explanation and review of diabetes co-morbidities, pre- and post-prandial blood glucose goals, pre-exercise blood glucose goals, signs, symptoms, and treatment of hypoglycemia and hyperglycemia, and foot care basics. Flowsheet Row CARDIAC REHAB PHASE II EXERCISE from 06/08/2016 in [place]  Date  06/03/16  Educator  RD  Instruction Review Code  2- meets goals/outcomes      Diabetes Blitz:  -Group instruction provided by PowerPoint slides, verbal discussion, and written materials to support subject matter. The instructor gives an explanation and review of the physiology behind type 1 and type 2 diabetes, diabetes medications and rational behind using different medications, pre- and post-prandial blood glucose recommendations and Hemoglobin A1c goals, diabetes diet, and exercise including blood glucose guidelines for exercising  safely.    Portion Distortion:  -Group instruction provided by PowerPoint slides, verbal discussion, written materials, and food models to support subject matter. The instructor gives an explanation of serving size versus portion size, changes in portions sizes over the last 20 years, and what consists of a serving from each food group. Flowsheet Row CARDIAC REHAB PHASE II EXERCISE from 06/08/2016 in [place]  Date  06/01/16  Educator  RD  Instruction Review Code  2- meets goals/outcomes      Stress Management:  -Group instruction provided by verbal instruction, video, and written materials to support subject matter.  Instructors review role of stress in heart disease and how to cope with stress positively.   Flowsheet Row CARDIAC REHAB PHASE II EXERCISE from 06/08/2016 in [place]  Date  05/25/16  Instruction Review Code  2- meets goals/outcomes      Exercising on Your Own:  -Group instruction provided by verbal instruction, power point, and written materials to support subject.  Instructors discuss benefits of exercise, components of exercise, frequency and intensity of exercise, and end points for exercise.  Also discuss use of nitroglycerin and activating EMS.  Review options of places to exercise outside of rehab.  Review guidelines for sex with heart disease. Flowsheet Row CARDIAC REHAB PHASE II EXERCISE from 06/08/2016 in [place]  Date  05/18/16  Instruction Review Code  2- meets goals/outcomes      Cardiac Drugs I:  -Group instruction provided by verbal instruction and written materials to support subject.  Instructor reviews cardiac drug classes: antiplatelets, anticoagulants, beta blockers, and statins.  Instructor discusses reasons, side effects, and lifestyle considerations for each drug class. Flowsheet Row CARDIAC REHAB PHASE II EXERCISE from 06/08/2016 in [place]  Date  06/08/16  Educator  pharmD   Instruction Review Code  2- meets goals/outcomes      Cardiac Drugs II:  -Group instruction provided by verbal instruction and written materials to support subject.  Instructor reviews cardiac drug classes: angiotensin converting enzyme inhibitors (ACE-I), angiotensin II receptor blockers (ARBs), nitrates, and calcium channel blockers.  Instructor discusses reasons, side effects, and lifestyle considerations for each drug class. Flowsheet Row CARDIAC REHAB PHASE II EXERCISE from 06/08/2016 in [facility]  [place]  Date  05/04/16  Instruction Review Code  2- meets goals/outcomes      Anatomy and Physiology of the Circulatory System:  -Group instruction provided by verbal instruction, video, and written materials to support subject.  Reviews functional anatomy of heart, how it relates to various diagnoses, and what role the heart plays in the overall system. Flowsheet Row CARDIAC REHAB PHASE II EXERCISE from 06/08/2016 in [place]  Date  05/11/16  Instruction Review Code  2- meets goals/outcomes      Knowledge Questionnaire Score:     Knowledge Questionnaire Score - 04/29/16 1149      Knowledge Questionnaire Score   Pre Score 23/24      Core Components/Risk Factors/Patient Goals at Admission:     Personal Goals and Risk Factors at Admission - 04/19/16 1630      Core Components/Risk Factors/Patient Goals on Admission    Weight Management Obesity   Increase Strength and Stamina Yes   Intervention Provide advice, education, support and counseling about physical activity/exercise needs.;Develop an individualized exercise prescription for aerobic and resistive training based on initial evaluation findings, risk stratification, comorbidities and participant's personal goals.   Expected Outcomes Achievement of increased cardiorespiratory fitness and enhanced flexibility,  muscular endurance and strength shown through measurements of functional capacity and personal statement of participant.   Lipids Yes   Intervention Provide education and support for participant on nutrition & aerobic/resistive exercise along with prescribed medications to achieve LDL 70mg , HDL >40mg .   Expected Outcomes Short Term: Participant states understanding of desired cholesterol values and is compliant with medications prescribed. Participant is following exercise prescription and nutrition guidelines.;Long Term: Cholesterol controlled with medications as prescribed, with individualized exercise RX and with personalized nutrition plan. Value goals: LDL < 70mg , HDL > 40 mg.      Core Components/Risk Factors/Patient Goals Review:      Goals and Risk Factor Review    Row Name 06/03/16 1628             Core Components/Risk Factors/Patient Goals Review   Personal Goals Review Other;Increase Strength and Stamina       Review Strength needs improvement but heading in the right direction. Energy levels have increased       Expected Outcomes Pt energy levels and strength will continue improve           Core Components/Risk Factors/Patient Goals at Discharge (Final Review):      Goals and Risk Factor Review - 06/03/16 1628      Core Components/Risk Factors/Patient Goals Review   Personal Goals Review Other;Increase Strength and Stamina   Review Strength needs improvement but heading in the right direction. Energy levels have increased   Expected Outcomes Pt energy levels and strength will continue improve       ITP Comments:     ITP Comments    Row Name 04/19/16 1349           ITP Comments Medical Director - Dr. Fransico Him, MD          Comments: Pt is making expected progress toward personal goals after completing 17 sessions. Recommend continued exercise and life style modification education including  stress management and relaxation techniques to decrease cardiac  risk profile.

## 2016-06-08 ENCOUNTER — Encounter (HOSPITAL_COMMUNITY)
Admission: RE | Admit: 2016-06-08 | Discharge: 2016-06-08 | Disposition: A | Discharge status: Home / Self Care | Payer: Medicare Other | Source: Ambulatory Visit | Attending: Cardiovascular Disease | Admitting: Cardiovascular Disease

## 2016-06-08 DIAGNOSIS — I214 Non-ST elevation (NSTEMI) myocardial infarction: Secondary | ICD-10-CM

## 2016-06-09 ENCOUNTER — Encounter: Payer: Self-pay | Admitting: Cardiovascular Disease

## 2016-06-10 ENCOUNTER — Encounter (HOSPITAL_COMMUNITY)
Admission: RE | Admit: 2016-06-10 | Discharge: 2016-06-10 | Disposition: A | Discharge status: Home / Self Care | Payer: Medicare Other | Source: Ambulatory Visit | Attending: Cardiovascular Disease | Admitting: Cardiovascular Disease

## 2016-06-10 DIAGNOSIS — I214 Non-ST elevation (NSTEMI) myocardial infarction: Secondary | ICD-10-CM

## 2016-06-13 ENCOUNTER — Encounter (HOSPITAL_COMMUNITY)
Admission: RE | Admit: 2016-06-13 | Discharge: 2016-06-13 | Disposition: A | Discharge status: Home / Self Care | Payer: Medicare Other | Source: Ambulatory Visit | Attending: Cardiovascular Disease | Admitting: Cardiovascular Disease

## 2016-06-13 DIAGNOSIS — I214 Non-ST elevation (NSTEMI) myocardial infarction: Secondary | ICD-10-CM

## 2016-06-15 ENCOUNTER — Encounter (HOSPITAL_COMMUNITY)
Admission: RE | Admit: 2016-06-15 | Discharge: 2016-06-15 | Disposition: A | Discharge status: Home / Self Care | Payer: Medicare Other | Source: Ambulatory Visit | Attending: Cardiovascular Disease | Admitting: Cardiovascular Disease

## 2016-06-15 DIAGNOSIS — I214 Non-ST elevation (NSTEMI) myocardial infarction: Secondary | ICD-10-CM

## 2016-06-17 ENCOUNTER — Encounter (HOSPITAL_COMMUNITY)
Admission: RE | Admit: 2016-06-17 | Discharge: 2016-06-17 | Disposition: A | Discharge status: Home / Self Care | Payer: Medicare Other | Source: Ambulatory Visit | Attending: Cardiovascular Disease | Admitting: Cardiovascular Disease

## 2016-06-17 DIAGNOSIS — I214 Non-ST elevation (NSTEMI) myocardial infarction: Secondary | ICD-10-CM

## 2016-06-20 ENCOUNTER — Encounter (HOSPITAL_COMMUNITY)
Admission: RE | Admit: 2016-06-20 | Discharge: 2016-06-20 | Disposition: A | Discharge status: Home / Self Care | Payer: Medicare Other | Source: Ambulatory Visit | Attending: Cardiovascular Disease | Admitting: Cardiovascular Disease

## 2016-06-20 DIAGNOSIS — I214 Non-ST elevation (NSTEMI) myocardial infarction: Secondary | ICD-10-CM

## 2016-06-22 ENCOUNTER — Encounter (HOSPITAL_COMMUNITY)
Admission: RE | Admit: 2016-06-22 | Discharge: 2016-06-22 | Disposition: A | Discharge status: Home / Self Care | Payer: Medicare Other | Source: Ambulatory Visit | Attending: Cardiovascular Disease | Admitting: Cardiovascular Disease

## 2016-06-22 DIAGNOSIS — I214 Non-ST elevation (NSTEMI) myocardial infarction: Secondary | ICD-10-CM

## 2016-06-23 ENCOUNTER — Encounter: Payer: Self-pay | Admitting: Cardiovascular Disease

## 2016-06-23 ENCOUNTER — Encounter (INDEPENDENT_AMBULATORY_CARE_PROVIDER_SITE_OTHER): Payer: Self-pay

## 2016-06-23 ENCOUNTER — Ambulatory Visit (INDEPENDENT_AMBULATORY_CARE_PROVIDER_SITE_OTHER): Payer: Medicare Other | Admitting: Cardiovascular Disease

## 2016-06-23 VITALS — BP 128/64 | HR 80 | Ht 69.0 in | Wt 208.8 lb

## 2016-06-23 DIAGNOSIS — I255 Ischemic cardiomyopathy: Secondary | ICD-10-CM

## 2016-06-23 DIAGNOSIS — E785 Hyperlipidemia, unspecified: Secondary | ICD-10-CM

## 2016-06-23 DIAGNOSIS — I2510 Atherosclerotic heart disease of native coronary artery without angina pectoris: Secondary | ICD-10-CM

## 2016-06-23 MED ORDER — CLOPIDOGREL BISULFATE 75 MG PO TABS: 75.0000 mg | ORAL_TABLET | Freq: Every day | ORAL | 11 refills | Status: DC

## 2016-06-23 MED ORDER — ATORVASTATIN CALCIUM 80 MG PO TABS: 80.0000 mg | ORAL_TABLET | Freq: Every day | ORAL | 11 refills | Status: DC

## 2016-06-23 MED ORDER — LISINOPRIL 2.5 MG PO TABS: 2.5000 mg | ORAL_TABLET | Freq: Every day | ORAL | 11 refills | Status: DC

## 2016-06-23 MED ORDER — METOPROLOL SUCCINATE ER 25 MG PO TB24: 25.0000 mg | ORAL_TABLET | Freq: Every day | ORAL | 11 refills | Status: DC

## 2016-06-23 NOTE — Patient Instructions (Signed)

## 2016-06-23 NOTE — Progress Notes (Signed)
Chief Complaint  Patient presents with  . Coronary Artery Disease     History of Present Illness: 75 yo male with history of COPD, asthma, hyperlipidemia, CAD (NSTEMI 02/2016 with occ mLAD & occ small PDA both with collaterals, treated medically), ICM EF (~40-50%) who is here today for follow up. He was admitted to [facility] 03/07/16 with chest pain and dyspnea. He ruled in for NSTEMI (peak trop 10.68) and underwent cardiac cath on 03/07/16 per Dr. Martinique. This showed 100% occlusion of the mid LAD and 100% occlusion of the small PDA, both vessels filling from collaterals. His LAD occlusion was felt to be more than 12 hours from presentation so the plan was for medical management. LVEF=40-45% by echo. He was not started on ASA due to intolerance. He was discharged on Plavix, statin and beta blocker. He was also found to have a pancreatic lesion by abd [place] (MRI raising question of pseudocyst or side branch IPMN) - [facility] did not feel inpatient consult was necessary and arranged OP f/u. He followed up with GI, Dr. Paulita Fujita and the plan is to follow the lesion for now.    He is here today for follow up. No chest pain or SOB.   Primary Care Physician: Philis Fendt, MD   Past Medical History:  Diagnosis Date  . Asthma   . CAD in native artery    a. NSTEMI 02/2016 with occ mLAD & occ small PDA both with collaterals, treated medically  . COPD (chronic obstructive pulmonary disease) ([place])   . Hyperlipidemia   . Ischemic cardiomyopathy    a. 02/2016: EF 45-50% by cath, 40-45% by echo.  . Pancreatic lesion     Past Surgical History:  Procedure Laterality Date  . CARDIAC CATHETERIZATION N/A 03/07/2016   Procedure: Left Heart Cath and Coronary Angiography;  Surgeon: [name], MD;  Location: [facility];  Service: Cardiovascular;  Laterality: N/A;    Current Outpatient Prescriptions  Medication Sig Dispense Refill  . albuterol (PROVENTIL) (2.5 MG/3ML) 0.083% nebulizer solution Take 2.5  mg by nebulization every 6 (six) hours as needed for wheezing or shortness of breath.    Marland Kitchen atorvastatin (LIPITOR) 80 MG tablet Take 1 tablet (80 mg total) by mouth daily at 6 PM. 30 tablet 11  . clopidogrel (PLAVIX) 75 MG tablet Take 1 tablet (75 mg total) by mouth daily. 30 tablet 11  . Fluticasone-Salmeterol (ADVAIR) 250-50 MCG/DOSE AEPB Inhale 1 puff into the lungs 2 (two) times daily as needed (shortness of breath). Reported on 04/19/2016    . lisinopril (PRINIVIL,ZESTRIL) 2.5 MG tablet Take 1 tablet (2.5 mg total) by mouth daily. 30 tablet 11  . metoprolol succinate (TOPROL-XL) 25 MG 24 hr tablet Take 1 tablet (25 mg total) by mouth daily. 30 tablet 11  . Testosterone (FORTESTA) 10 MG/ACT (2%) GEL Place 40 mg onto the skin every evening. 4 pumps     No current facility-administered medications for this visit.     Allergies  Allergen Reactions  . Nsaids Shortness Of Breath  . Nitroglycerin Other (See Comments)    Pt reported BP dropped very low when he took oral NTG    Social History   Social History  . Marital status: Married    Spouse name: N/A  . Number of children: N/A  . Years of education: N/A   Occupational History  . Retired [employer] conservation officer and [employer]    Social History Main Topics  . Smoking status: Never Smoker  .  Smokeless tobacco: Never Used  . Alcohol use No  . Drug use: No  . Sexual activity: Not on file   Other Topics Concern  . Not on file   Social History Narrative   Lives in [place] with wife    Family History  Problem Relation Age of Onset  . Cancer Father     Review of Systems:  As stated in the HPI and otherwise negative.   BP 128/64   Pulse 80   Ht 5\' 9"  (1.753 m)   Wt 208 lb 12.8 oz (94.7 kg)   BMI 30.83 kg/m   Physical Examination: General: Well developed, well nourished, NAD  HEENT: OP clear, mucus membranes moist  SKIN: warm, dry. No rashes. Neuro: No focal deficits  Musculoskeletal: Muscle strength 5/5 all ext  Psychiatric: Mood and affect  normal  Neck: No JVD, no carotid bruits, no thyromegaly, no lymphadenopathy.  Lungs:Clear bilaterally, no wheezes, rhonci, crackles Cardiovascular: Regular rate and rhythm. No murmurs, gallops or rubs. Abdomen:Soft. Bowel sounds present. Non-tender.  Extremities: No lower extremity edema. Pulses are 2 + in the bilateral DP/PT.  Cardiac cath 03/07/16:  Ost RPDA lesion, 100% stenosed.  Mid LAD lesion, 100% stenosed.  There is mild to moderate left ventricular systolic dysfunction.   1. 2 vessel occlusive CAD.    -100% mid LAD after the second diagonal.     -100% flush occlusion of the PDA 2. Mild to moderate LV dysfunction  Echo 03/07/16: Left ventricle: The cavity size was normal. Systolic function was   mildly to moderately reduced. The estimated ejection fraction was   in the range of 40% to 45%. Wall motion was normal; there were no   regional wall motion abnormalities. Doppler parameters are   consistent with abnormal left ventricular relaxation (grade 1   diastolic dysfunction). There was no evidence of elevated   ventricular filling pressure by Doppler parameters. - Aortic valve: Trileaflet; mildly thickened, mildly calcified   leaflets. There was no regurgitation. - Aortic root: The aortic root was normal in size. - Mitral valve: Calcified annulus. There was no regurgitation. - Left atrium: The atrium was mildly dilated. - Right ventricle: Systolic function was normal. - Tricuspid valve: There was trivial regurgitation. - Pulmonary arteries: Systolic pressure was within the normal   range. - Inferior vena cava: The vessel was normal in size. - Pericardium, extracardiac: There was no pericardial effusion.  Impressions:  - Wall motion abnormalities suspicious for hibernation or infarct   in the LAD terrotory.  EKG:  EKG is not ordered today. The ekg ordered today demonstrates   Recent Labs: 03/07/2016: TSH 1.988 03/10/2016: Hemoglobin 14.3; Platelets 177 04/29/2016:  ALT 36; BUN 13; Creat 0.83; Potassium 4.3; Sodium 136   Lipid Panel    Component Value Date/Time   CHOL 113 (L) 04/29/2016 1004   TRIG 139 04/29/2016 1004   HDL 22 (L) 04/29/2016 1004   CHOLHDL 5.1 (H) 04/29/2016 1004   VLDL 28 04/29/2016 1004   LDLCALC 63 04/29/2016 1004     Wt Readings from Last 3 Encounters:  06/23/16 208 lb 12.8 oz (94.7 kg)  04/19/16 212 lb 4.9 oz (96.3 kg)  03/18/16 215 lb 12.8 oz (97.9 kg)     Other studies Reviewed: Additional studies/ records that were reviewed today include: . Review of the above records demonstrates:   Assessment and Plan:   1. CAD: He is having no chest pain suggestive of angina. Will continue Plavix, beta blocker and statin. He is  not on ASA due to intolerance. He is participating in cardiac rehab.   2. Ischemic cardiomyopathy: Will continue beta blocker and Ace-inh. Repeat echo in 6 months.   3. Hyperlipidemia: Will continue statin. LDL at goal.   Current medicines are reviewed at length with the patient today.  The patient does not have concerns regarding medicines.  The following changes have been made:  no change  Labs/ tests ordered today include:  No orders of the defined types were placed in this encounter.   Disposition:   FU with me in 6 months  Signed, [name], MD 06/23/2016 10:07 AM    [facility] [address], [place], [place]  [address] Phone: [phone]; Fax: [phone]

## 2016-06-24 ENCOUNTER — Encounter (HOSPITAL_COMMUNITY)
Admission: RE | Admit: 2016-06-24 | Discharge: 2016-06-24 | Disposition: A | Discharge status: Home / Self Care | Payer: Medicare Other | Source: Ambulatory Visit | Attending: Cardiovascular Disease | Admitting: Cardiovascular Disease

## 2016-06-24 DIAGNOSIS — Z955 Presence of coronary angioplasty implant and graft: Secondary | ICD-10-CM | POA: Diagnosis present

## 2016-06-24 DIAGNOSIS — I214 Non-ST elevation (NSTEMI) myocardial infarction: Secondary | ICD-10-CM | POA: Diagnosis present

## 2016-06-29 ENCOUNTER — Encounter (HOSPITAL_COMMUNITY)
Admission: RE | Admit: 2016-06-29 | Discharge: 2016-06-29 | Disposition: A | Discharge status: Home / Self Care | Payer: Medicare Other | Source: Ambulatory Visit | Attending: Cardiovascular Disease | Admitting: Cardiovascular Disease

## 2016-06-29 DIAGNOSIS — I214 Non-ST elevation (NSTEMI) myocardial infarction: Secondary | ICD-10-CM | POA: Diagnosis not present

## 2016-07-01 ENCOUNTER — Encounter (HOSPITAL_COMMUNITY)
Admission: RE | Admit: 2016-07-01 | Discharge: 2016-07-01 | Disposition: A | Discharge status: Home / Self Care | Payer: Medicare Other | Source: Ambulatory Visit | Attending: Cardiovascular Disease | Admitting: Cardiovascular Disease

## 2016-07-01 DIAGNOSIS — I214 Non-ST elevation (NSTEMI) myocardial infarction: Secondary | ICD-10-CM | POA: Diagnosis not present

## 2016-07-04 ENCOUNTER — Encounter (HOSPITAL_COMMUNITY)
Admission: RE | Admit: 2016-07-04 | Discharge: 2016-07-04 | Disposition: A | Discharge status: Home / Self Care | Payer: Medicare Other | Source: Ambulatory Visit | Attending: Cardiovascular Disease | Admitting: Cardiovascular Disease

## 2016-07-04 DIAGNOSIS — I214 Non-ST elevation (NSTEMI) myocardial infarction: Secondary | ICD-10-CM

## 2016-07-05 NOTE — Progress Notes (Signed)
Cardiac Individual Treatment Plan  Patient Details  Name: Frank Ayala MRN: [id number] Date of Birth: [birth year] Referring Provider:   Flowsheet Row CARDIAC REHAB PHASE II ORIENTATION from 04/19/2016 in [place]  Referring Provider  [name], MD      Initial Encounter Date:  [place] PHASE II ORIENTATION from 04/19/2016 in [place]  Date  04/19/16  Referring Provider  [name], MD      Visit Diagnosis: NSTEMI (non-ST elevated myocardial infarction) [facility])  Patient's Home Medications on Admission:  Current Outpatient Prescriptions:  .  albuterol (PROVENTIL) (2.5 MG/3ML) 0.083% nebulizer solution, Take 2.5 mg by nebulization every 6 (six) hours as needed for wheezing or shortness of breath., Disp: , Rfl:  .  atorvastatin (LIPITOR) 80 MG tablet, Take 1 tablet (80 mg total) by mouth daily at 6 PM., Disp: 30 tablet, Rfl: 11 .  clopidogrel (PLAVIX) 75 MG tablet, Take 1 tablet (75 mg total) by mouth daily., Disp: 30 tablet, Rfl: 11 .  Fluticasone-Salmeterol (ADVAIR) 250-50 MCG/DOSE AEPB, Inhale 1 puff into the lungs 2 (two) times daily as needed (shortness of breath). Reported on 04/19/2016, Disp: , Rfl:  .  lisinopril (PRINIVIL,ZESTRIL) 2.5 MG tablet, Take 1 tablet (2.5 mg total) by mouth daily., Disp: 30 tablet, Rfl: 11 .  metoprolol succinate (TOPROL-XL) 25 MG 24 hr tablet, Take 1 tablet (25 mg total) by mouth daily., Disp: 30 tablet, Rfl: 11 .  Testosterone (FORTESTA) 10 MG/ACT (2%) GEL, Place 40 mg onto the skin every evening. 4 pumps, Disp: , Rfl:   Past Medical History: Past Medical History:  Diagnosis Date  . Asthma   . CAD in native artery    a. NSTEMI 02/2016 with occ mLAD & occ small PDA both with collaterals, treated medically  . COPD (chronic obstructive pulmonary disease) ([place])   . Hyperlipidemia   . Ischemic cardiomyopathy    a. 02/2016: EF 45-50% by cath,  40-45% by echo.  . Pancreatic lesion     Tobacco Use: History  Smoking Status  . Never Smoker  Smokeless Tobacco  . Never Used    Labs: Recent Review Flowsheet Data    Labs for ITP Cardiac and Pulmonary Rehab Latest Ref Rng & Units 03/07/2016 03/08/2016 04/29/2016   Cholestrol 125 - 200 mg/dL - 182 113(L)   LDLCALC <130 mg/dL - 132(H) 63   HDL >=40 mg/dL - 25(L) 22(L)   Trlycerides <150 mg/dL - 127 139   Hemoglobin A1c 4.8 - 5.6 % 6.0(H) - -      Capillary Blood Glucose: Lab Results  Component Value Date   GLUCAP 74 03/09/2016   GLUCAP 96 03/09/2016   GLUCAP 107 (H) 03/08/2016   GLUCAP 130 (H) 03/07/2016     Exercise Target Goals:    Exercise Program Goal: Individual exercise prescription set with THRR, safety & activity barriers. Participant demonstrates ability to understand and report RPE using BORG scale, to self-measure pulse accurately, and to acknowledge the importance of the exercise prescription.  Exercise Prescription Goal: Starting with aerobic activity 30 plus minutes a day, 3 days per week for initial exercise prescription. Provide home exercise prescription and guidelines that participant acknowledges understanding prior to discharge.  Activity Barriers & Risk Stratification:     Activity Barriers & Cardiac Risk Stratification - 04/19/16 1402      Activity Barriers & Cardiac Risk Stratification   Activity Barriers Neck/Spine Problems   Cardiac Risk Stratification Moderate  6 Minute Walk:     6 Minute Walk    Row Name 04/19/16 1629         6 Minute Walk   Phase Initial     Distance 1617 feet     Walk Time 6 minutes     # of Rest Breaks 0     MPH 3.06     METS 3.33     RPE 11     VO2 Peak 11.64     Symptoms No     Resting HR 92 bpm     Resting BP 134/64     Max Ex. HR 123 bpm     Max Ex. BP 144/62     2 Minute Post BP 128/68        Initial Exercise Prescription:     Initial Exercise Prescription - 04/20/16 0700      Date  of Initial Exercise RX and Referring Provider   Date 04/19/16   Referring Provider [name], MD     Treadmill   MPH 2.2   Grade 1   Minutes 10   METs 2.99     Bike   Level 0.9   Minutes 10   METs 2.79     NuStep   Level 2   Minutes 10   METs 2.5     Prescription Details   Frequency (times per week) 3   Duration Progress to 30 minutes of continuous aerobic without signs/symptoms of physical distress     Intensity   THRR 40-80% of Max Heartrate 58-117   Ratings of Perceived Exertion 11-13   Perceived Dyspnea 0-4     Progression   Progression Continue to progress workloads to maintain intensity without signs/symptoms of physical distress.     Resistance Training   Training Prescription Yes   Weight 2 lb.   Reps 10-12      Perform Capillary Blood Glucose checks as needed.  Exercise Prescription Changes:      Exercise Prescription Changes    Row Name 05/05/16 1600 05/11/16 1600 05/18/16 1600 06/03/16 1600 06/16/16 1200     Exercise Review   Progression Yes Yes Yes Yes Yes     Response to Exercise   Blood Pressure (Admit) 110/64 124/60 118/70 142/80 120/60   Blood Pressure (Exercise) 128/58 142/60 166/82 160/80 140/70   Blood Pressure (Exit) 108/64 114/54 122/72 118/62 126/62   Heart Rate (Admit) 79 bpm 82 bpm 71 bpm 82 bpm 78 bpm   Heart Rate (Exercise) 137 bpm 136 bpm 127 bpm 134 bpm 130 bpm   Heart Rate (Exit) 86 bpm 86 bpm 82 bpm 93 bpm 90 bpm   Rating of Perceived Exertion (Exercise) 12 12 12 12 12    Duration Progress to 30 minutes of continuous aerobic without signs/symptoms of physical distress Progress to 30 minutes of continuous aerobic without signs/symptoms of physical distress Progress to 30 minutes of continuous aerobic without signs/symptoms of physical distress Progress to 30 minutes of continuous aerobic without signs/symptoms of physical distress Progress to 30 minutes of continuous aerobic without signs/symptoms of physical distress    Intensity THRR unchanged THRR unchanged THRR unchanged THRR unchanged THRR unchanged     Progression   Progression Continue to progress workloads to maintain intensity without signs/symptoms of physical distress. Continue to progress workloads to maintain intensity without signs/symptoms of physical distress. Continue to progress workloads to maintain intensity without signs/symptoms of physical distress. Continue to progress workloads to maintain intensity without signs/symptoms of physical  distress. Continue to progress workloads to maintain intensity without signs/symptoms of physical distress.   Average METs 3.13 5.3 3.6 3.7 4.1     Resistance Training   Training Prescription Yes Yes Yes Yes Yes   Weight 2 lb. 2 lb. 4lbs 4lbs 4lbs   Reps 10-12 10-12 10-12 10-12 10-12     Treadmill   MPH 2.3 2.3 2.3 2.3 2.3   Grade 0 0 0 1 1   Minutes 10 10 10 10 10    METs 2.76 2.76 2.76 3.08 3.08     Bike   Level 0.9 2 1.5  prev. column was pt working harder than ex rx 1.5  prev. column was pt working harder than ex rx 1.5  prev. column was pt working harder than ex rx   Minutes 10 10 10 10 10    METs 2.79 2.83 4.02 4.02 4.02     NuStep   Level 4 4 4 4 4    Minutes 10 10 10 10 10    METs 3.5 4.1 4 4.2 4.1     Home Exercise Plan   Plans to continue exercise at  - Home  reviewed on 7/19/17l see progress note Home  reviewed on 7/19/17l see progress note Home  reviewed on 7/19/17l see progress note Home  reviewed on 7/19/17l see progress note   Frequency  - Add 2 additional days to program exercise sessions. Add 2 additional days to program exercise sessions. Add 2 additional days to program exercise sessions. Add 2 additional days to program exercise sessions.   [place] Name 07/01/16 1000             Exercise Review   Progression Yes         Response to Exercise   Blood Pressure (Admit) 128/64       Blood Pressure (Exercise) 160/60       Blood Pressure (Exit) 124/60       Heart Rate  (Admit) 76 bpm       Heart Rate (Exercise) 121 bpm       Heart Rate (Exit) 70 bpm       Rating of Perceived Exertion (Exercise) 11       Duration Progress to 30 minutes of continuous aerobic without signs/symptoms of physical distress       Intensity THRR unchanged         Progression   Progression Continue to progress workloads to maintain intensity without signs/symptoms of physical distress.       Average METs 4.1         Resistance Training   Training Prescription Yes       Weight 5lbs       Reps 10-12         Treadmill   MPH 2.3       Grade 2       Minutes 10       METs 3.4         Bike   Level 1.5  prev. column was pt working harder than ex rx       Minutes 10       METs 4.02         NuStep   Level 4       Minutes 10       METs 4.8         Home Exercise Plan   Plans to continue exercise at Home  reviewed on 7/19/17l see progress note  Frequency Add 2 additional days to program exercise sessions.          Exercise Comments:      Exercise Comments    Row Name 05/05/16 1622 06/03/16 1628 06/16/16 1208 07/01/16 1042     Exercise Comments Pt is tolerating exercises very well, will continue to monitor exercise progression Reviewed METs and goals. Pt is tolerating exercises very well, will continue to monitor exercise progression Reviewed METs. Pt is tolerating exercises very well, will continue to monitor exercise progression Reviewed METs and goals. Pt is tolerating exercises very well, will continue to monitor exercise progression       Discharge Exercise Prescription (Final Exercise Prescription Changes):     Exercise Prescription Changes - 07/01/16 1000      Exercise Review   Progression Yes     Response to Exercise   Blood Pressure (Admit) 128/64   Blood Pressure (Exercise) 160/60   Blood Pressure (Exit) 124/60   Heart Rate (Admit) 76 bpm   Heart Rate (Exercise) 121 bpm   Heart Rate (Exit) 70 bpm   Rating of Perceived Exertion (Exercise) 11    Duration Progress to 30 minutes of continuous aerobic without signs/symptoms of physical distress   Intensity THRR unchanged     Progression   Progression Continue to progress workloads to maintain intensity without signs/symptoms of physical distress.   Average METs 4.1     Resistance Training   Training Prescription Yes   Weight 5lbs   Reps 10-12     Treadmill   MPH 2.3   Grade 2   Minutes 10   METs 3.4     Bike   Level 1.5  prev. column was pt working harder than ex rx   Minutes 10   METs 4.02     NuStep   Level 4   Minutes 10   METs 4.8     Home Exercise Plan   Plans to continue exercise at Home  reviewed on 7/19/17l see progress note   Frequency Add 2 additional days to program exercise sessions.      Nutrition:  Target Goals: Understanding of nutrition guidelines, daily intake of sodium 1500mg , cholesterol 200mg , calories 30% from fat and 7% or less from saturated fats, daily to have 5 or more servings of fruits and vegetables.  Biometrics:     Pre Biometrics - 04/19/16 1627      Pre Biometrics   Height 5' 8.25" (1.734 m)   Weight 212 lb 4.9 oz (96.3 kg)   Waist Circumference 40.5 inches   Hip Circumference 44.5 inches   Waist to Hip Ratio 0.91 %   BMI (Calculated) 32.1   Triceps Skinfold 21 mm   % Body Fat 30.9 %   Grip Strength 42 kg   Flexibility 7 in   Single Leg Stand 7.59 seconds       Nutrition Therapy Plan and Nutrition Goals:     Nutrition Therapy & Goals - 05/30/16 0924      Nutrition Therapy   Diet Therapeutic Lifestyle Changes     Personal Nutrition Goals   Personal Goal #1 1-2 lb wt loss per week to a goal wt loss of 6-24 lb at graduation from [facility], educate and counsel regarding individualized specific dietary modifications aiming towards targeted core components such as weight, hypertension, lipid management, diabetes, heart failure and other  comorbidities.;Nutrition handout(s) given to patient.  Pre-diabetes handout given  Expected Outcomes Short Term Goal: Understand basic principles of dietary content, such as calories, fat, sodium, cholesterol and nutrients.;Long Term Goal: Adherence to prescribed nutrition plan.      Nutrition Discharge: Nutrition Scores:     Nutrition Assessments - 05/02/16 1520      MEDFICTS Scores   Pre Score 3      Nutrition Goals Re-Evaluation:   Psychosocial: Target Goals: Acknowledge presence or absence of depression, maximize coping skills, provide positive support system. Participant is able to verbalize types and ability to use techniques and skills needed for reducing stress and depression.  Initial Review & Psychosocial Screening:     Initial Psych Review & Screening - 04/27/16 0933      Family Dynamics   Good Support System? Yes     Barriers   Psychosocial barriers to participate in program There are no identifiable barriers or psychosocial needs.;The patient should benefit from training in stress management and relaxation.     Screening Interventions   Interventions Encouraged to exercise      Quality of Life Scores:     Quality of Life - 04/27/16 1309      Quality of Life Scores   Health/Function Pre 26.4 %   Socioeconomic Pre 28.75 %   Psych/Spiritual Pre 28.29 %   Family Pre 30 %   GLOBAL Pre 27.77 %      PHQ-9: Recent Review Flowsheet Data    Depression screen [facility] 2/9 04/27/2016   Decreased Interest 0   Down, Depressed, Hopeless 0   PHQ - 2 Score 0      Psychosocial Evaluation and Intervention:     Psychosocial Evaluation - 05/04/16 1728      Psychosocial Evaluation & Interventions   Interventions Stress management education;Relaxation education;Encouraged to exercise with the program and follow exercise prescription   Continued Psychosocial Services Needed Yes      Psychosocial Re-Evaluation:     Psychosocial Re-Evaluation    [place] Name  05/30/16 0914 06/24/16 1014 07/05/16 1723         Psychosocial Re-Evaluation   Interventions Stress management education;Relaxation education;Encouraged to attend Cardiac Rehabilitation for the exercise  - Encouraged to attend Cardiac Rehabilitation for the exercise     Comments  - no psychosocial needs identified, no psychosocial interventions necessary. pt is encouraged to exercise and participate in stress management, relaxation to decrease cardiovascular risk factors.  no psychosocial needs identified, no psyshosocial interventions necessary. pt feels he has increased stamina and is able to participate in activity longer.      Continued Psychosocial Services Needed No No No        Vocational Rehabilitation: Provide vocational rehab assistance to qualifying candidates.   Vocational Rehab Evaluation & Intervention:   Education: Education Goals: Education classes will be provided on a weekly basis, covering required topics. Participant will state understanding/return demonstration of topics presented.  Learning Barriers/Preferences:   Education Topics: Count Your Pulse:  -Group instruction provided by verbal instruction, demonstration, patient participation and written materials to support subject.  Instructors address importance of being able to find your pulse and how to count your pulse when at home without a heart monitor.  Patients get hands on experience counting their pulse with staff help and individually. Flowsheet Row CARDIAC REHAB PHASE II EXERCISE from 07/06/2016 in [place]  Date  06/24/16  Educator  Maurice Small, RN  Instruction Review Code  R- Review/reinforce      Heart Attack, Angina, and Risk Factor  Modification:  -Group instruction provided by verbal instruction, video, and written materials to support subject.  Instructors address signs and symptoms of angina and heart attacks.    Also discuss risk factors for heart disease  and how to make changes to improve heart health risk factors.   Functional Fitness:  -Group instruction provided by verbal instruction, demonstration, patient participation, and written materials to support subject.  Instructors address safety measures for doing things around the house.  Discuss how to get up and down off the floor, how to pick things up properly, how to safely get out of a chair without assistance, and balance training. Flowsheet Row CARDIAC REHAB PHASE II EXERCISE from 07/06/2016 in [place]  Date  06/10/16  Instruction Review Code  2- meets goals/outcomes      Meditation and Mindfulness:  -Group instruction provided by verbal instruction, patient participation, and written materials to support subject.  Instructor addresses importance of mindfulness and meditation practice to help reduce stress and improve awareness.  Instructor also leads participants through a meditation exercise.  Flowsheet Row CARDIAC REHAB PHASE II EXERCISE from 07/06/2016 in [place]  Date  06/22/16  Educator  RD  Instruction Review Code  2- meets goals/outcomes      Stretching for Flexibility and Mobility:  -Group instruction provided by verbal instruction, patient participation, and written materials to support subject.  Instructors lead participants through series of stretches that are designed to increase flexibility thus improving mobility.  These stretches are additional exercise for major muscle groups that are typically performed during regular warm up and cool down. Flowsheet Row CARDIAC REHAB PHASE II EXERCISE from 07/06/2016 in [place]  Date  06/17/16  Instruction Review Code  2- meets goals/outcomes      Hands Only CPR Anytime:  -Group instruction provided by verbal instruction, video, patient participation and written materials to support subject.  Instructors co-teach with [facility] video  for hands only CPR.  Participants get hands on experience with mannequins.   Nutrition I class: Heart Healthy Eating:  -Group instruction provided by PowerPoint slides, verbal discussion, and written materials to support subject matter. The instructor gives an explanation and review of the Therapeutic Lifestyle Changes diet recommendations, which includes a discussion on lipid goals, dietary fat, sodium, fiber, plant stanol/sterol esters, sugar, and the components of a well-balanced, healthy diet. Flowsheet Row CARDIAC REHAB PHASE II EXERCISE from 07/06/2016 in [place]  Date  05/30/16  Educator  RD  Instruction Review Code  Not applicable [class handout given]      Nutrition II class: Lifestyle Skills:  -Group instruction provided by PowerPoint slides, verbal discussion, and written materials to support subject matter. The instructor gives an explanation and review of label reading, grocery shopping for heart health, heart healthy recipe modifications, and ways to make healthier choices when eating out. Flowsheet Row CARDIAC REHAB PHASE II EXERCISE from 07/06/2016 in [place]  Date  05/30/16  Educator  RD  Instruction Review Code  Not applicable [class handout given]      Diabetes Question & Answer:  -Group instruction provided by PowerPoint slides, verbal discussion, and written materials to support subject matter. The instructor gives an explanation and review of diabetes co-morbidities, pre- and post-prandial blood glucose goals, pre-exercise blood glucose goals, signs, symptoms, and treatment of hypoglycemia and hyperglycemia, and foot care basics. Flowsheet Row CARDIAC REHAB PHASE II EXERCISE  from 07/06/2016 in [place]  Date  07/01/16  Educator  RD  Instruction Review Code  2- meets goals/outcomes      Diabetes Blitz:  -Group instruction provided by PowerPoint slides, verbal  discussion, and written materials to support subject matter. The instructor gives an explanation and review of the physiology behind type 1 and type 2 diabetes, diabetes medications and rational behind using different medications, pre- and post-prandial blood glucose recommendations and Hemoglobin A1c goals, diabetes diet, and exercise including blood glucose guidelines for exercising safely.    Portion Distortion:  -Group instruction provided by PowerPoint slides, verbal discussion, written materials, and food models to support subject matter. The instructor gives an explanation of serving size versus portion size, changes in portions sizes over the last 20 years, and what consists of a serving from each food group. Flowsheet Row CARDIAC REHAB PHASE II EXERCISE from 07/06/2016 in [place]  Date  06/01/16  Educator  RD  Instruction Review Code  2- meets goals/outcomes      Stress Management:  -Group instruction provided by verbal instruction, video, and written materials to support subject matter.  Instructors review role of stress in heart disease and how to cope with stress positively.   Flowsheet Row CARDIAC REHAB PHASE II EXERCISE from 07/06/2016 in [place]  Date  05/25/16  Instruction Review Code  2- meets goals/outcomes      Exercising on Your Own:  -Group instruction provided by verbal instruction, power point, and written materials to support subject.  Instructors discuss benefits of exercise, components of exercise, frequency and intensity of exercise, and end points for exercise.  Also discuss use of nitroglycerin and activating EMS.  Review options of places to exercise outside of rehab.  Review guidelines for sex with heart disease. Flowsheet Row CARDIAC REHAB PHASE II EXERCISE from 07/06/2016 in [place]  Date  05/18/16  Instruction Review Code  2- meets goals/outcomes       Cardiac Drugs I:  -Group instruction provided by verbal instruction and written materials to support subject.  Instructor reviews cardiac drug classes: antiplatelets, anticoagulants, beta blockers, and statins.  Instructor discusses reasons, side effects, and lifestyle considerations for each drug class. Flowsheet Row CARDIAC REHAB PHASE II EXERCISE from 07/06/2016 in [place]  Date  06/08/16  Educator  pharmD   Instruction Review Code  2- meets goals/outcomes      Cardiac Drugs II:  -Group instruction provided by verbal instruction and written materials to support subject.  Instructor reviews cardiac drug classes: angiotensin converting enzyme inhibitors (ACE-I), angiotensin II receptor blockers (ARBs), nitrates, and calcium channel blockers.  Instructor discusses reasons, side effects, and lifestyle considerations for each drug class. Flowsheet Row CARDIAC REHAB PHASE II EXERCISE from 07/06/2016 in [place]  Date  07/06/16  Instruction Review Code  2- meets goals/outcomes      Anatomy and Physiology of the Circulatory System:  -Group instruction provided by verbal instruction, video, and written materials to support subject.  Reviews functional anatomy of heart, how it relates to various diagnoses, and what role the heart plays in the overall system. Flowsheet Row CARDIAC REHAB PHASE II EXERCISE from 07/06/2016 in [place]  Date  06/29/16  Instruction Review Code  2- meets goals/outcomes      Knowledge Questionnaire Score:     Knowledge Questionnaire Score -  04/29/16 1149      Knowledge Questionnaire Score   Pre Score 23/24      Core Components/Risk Factors/Patient Goals at Admission:     Personal Goals and Risk Factors at Admission - 04/19/16 1630      Core Components/Risk Factors/Patient Goals on Admission    Weight Management Obesity   Increase Strength and Stamina  Yes   Intervention Provide advice, education, support and counseling about physical activity/exercise needs.;Develop an individualized exercise prescription for aerobic and resistive training based on initial evaluation findings, risk stratification, comorbidities and participant's personal goals.   Expected Outcomes Achievement of increased cardiorespiratory fitness and enhanced flexibility, muscular endurance and strength shown through measurements of functional capacity and personal statement of participant.   Lipids Yes   Intervention Provide education and support for participant on nutrition & aerobic/resistive exercise along with prescribed medications to achieve LDL 70mg , HDL >40mg .   Expected Outcomes Short Term: Participant states understanding of desired cholesterol values and is compliant with medications prescribed. Participant is following exercise prescription and nutrition guidelines.;Long Term: Cholesterol controlled with medications as prescribed, with individualized exercise RX and with personalized nutrition plan. Value goals: LDL < 70mg , HDL > 40 mg.      Core Components/Risk Factors/Patient Goals Review:      Goals and Risk Factor Review    Row Name 06/03/16 1628 06/29/16 1213           Core Components/Risk Factors/Patient Goals Review   Personal Goals Review Other;Increase Strength and Stamina Increase Strength and Stamina      Review Strength needs improvement but heading in the right direction. Energy levels have increased Slowly gaining muscle mass. Discussed increasing reps and sets. Also adding an addition days of strength training (Wednesdays)      Expected Outcomes Pt energy levels and strength will continue improve  Pt will increase muscle tone         Core Components/Risk Factors/Patient Goals at Discharge (Final Review):      Goals and Risk Factor Review - 06/29/16 1213      Core Components/Risk Factors/Patient Goals Review   Personal Goals Review  Increase Strength and Stamina   Review Slowly gaining muscle mass. Discussed increasing reps and sets. Also adding an addition days of strength training (Wednesdays)   Expected Outcomes Pt will increase muscle tone      ITP Comments:     ITP Comments    Row Name 04/19/16 1349           ITP Comments Medical Director - Dr. Fransico Him, MD          Comments: Pt is making expected progress toward personal goals after completing 28 sessions. Recommend continued exercise and life style modification education including  stress management and relaxation techniques to decrease cardiac risk profile.

## 2016-07-06 ENCOUNTER — Encounter (HOSPITAL_COMMUNITY)
Admission: RE | Admit: 2016-07-06 | Discharge: 2016-07-06 | Disposition: A | Discharge status: Home / Self Care | Payer: Medicare Other | Source: Ambulatory Visit | Attending: Cardiovascular Disease | Admitting: Cardiovascular Disease

## 2016-07-06 DIAGNOSIS — I214 Non-ST elevation (NSTEMI) myocardial infarction: Secondary | ICD-10-CM | POA: Diagnosis not present

## 2016-07-08 ENCOUNTER — Encounter (HOSPITAL_COMMUNITY)
Admission: RE | Admit: 2016-07-08 | Discharge: 2016-07-08 | Disposition: A | Discharge status: Home / Self Care | Payer: Medicare Other | Source: Ambulatory Visit | Attending: Cardiovascular Disease | Admitting: Cardiovascular Disease

## 2016-07-08 DIAGNOSIS — I214 Non-ST elevation (NSTEMI) myocardial infarction: Secondary | ICD-10-CM | POA: Diagnosis not present

## 2016-07-11 ENCOUNTER — Encounter (HOSPITAL_COMMUNITY)
Admission: RE | Admit: 2016-07-11 | Discharge: 2016-07-11 | Disposition: A | Discharge status: Home / Self Care | Payer: Medicare Other | Source: Ambulatory Visit | Attending: Cardiovascular Disease | Admitting: Cardiovascular Disease

## 2016-07-11 DIAGNOSIS — I214 Non-ST elevation (NSTEMI) myocardial infarction: Secondary | ICD-10-CM | POA: Diagnosis not present

## 2016-07-13 ENCOUNTER — Encounter (HOSPITAL_COMMUNITY)
Admission: RE | Admit: 2016-07-13 | Discharge: 2016-07-13 | Disposition: A | Discharge status: Home / Self Care | Payer: Medicare Other | Source: Ambulatory Visit | Attending: Cardiovascular Disease | Admitting: Cardiovascular Disease

## 2016-07-13 DIAGNOSIS — I214 Non-ST elevation (NSTEMI) myocardial infarction: Secondary | ICD-10-CM | POA: Diagnosis not present

## 2016-07-15 ENCOUNTER — Encounter (HOSPITAL_COMMUNITY)
Admission: RE | Admit: 2016-07-15 | Discharge: 2016-07-15 | Disposition: A | Discharge status: Home / Self Care | Payer: Medicare Other | Source: Ambulatory Visit | Attending: Cardiovascular Disease | Admitting: Cardiovascular Disease

## 2016-07-15 VITALS — Wt 201.9 lb

## 2016-07-15 DIAGNOSIS — I214 Non-ST elevation (NSTEMI) myocardial infarction: Secondary | ICD-10-CM

## 2016-07-18 ENCOUNTER — Encounter (HOSPITAL_COMMUNITY)
Admission: RE | Admit: 2016-07-18 | Discharge: 2016-07-18 | Disposition: A | Discharge status: Home / Self Care | Payer: Medicare Other | Source: Ambulatory Visit | Attending: Cardiovascular Disease | Admitting: Cardiovascular Disease

## 2016-07-18 DIAGNOSIS — I214 Non-ST elevation (NSTEMI) myocardial infarction: Secondary | ICD-10-CM

## 2016-07-20 ENCOUNTER — Encounter (HOSPITAL_COMMUNITY)
Admission: RE | Admit: 2016-07-20 | Discharge: 2016-07-20 | Disposition: A | Discharge status: Home / Self Care | Payer: Medicare Other | Source: Ambulatory Visit | Attending: Cardiovascular Disease | Admitting: Cardiovascular Disease

## 2016-07-20 DIAGNOSIS — I214 Non-ST elevation (NSTEMI) myocardial infarction: Secondary | ICD-10-CM

## 2016-07-22 ENCOUNTER — Encounter (HOSPITAL_COMMUNITY)
Admission: RE | Admit: 2016-07-22 | Discharge: 2016-07-22 | Disposition: A | Discharge status: Home / Self Care | Payer: Medicare Other | Source: Ambulatory Visit | Attending: Cardiovascular Disease | Admitting: Cardiovascular Disease

## 2016-07-22 ENCOUNTER — Encounter (HOSPITAL_COMMUNITY): Payer: Self-pay

## 2016-07-22 DIAGNOSIS — I214 Non-ST elevation (NSTEMI) myocardial infarction: Secondary | ICD-10-CM

## 2016-07-22 NOTE — Progress Notes (Signed)
Cardiac Individual Treatment Plan  Patient Details  Name: SHAKIM FAITH MRN: [id number] Date of Birth: [birth year] Referring Provider:   Flowsheet Row CARDIAC REHAB PHASE II ORIENTATION from 04/19/2016 in [place]  Referring Provider  [name], MD      Initial Encounter Date:  [place] PHASE II ORIENTATION from 04/19/2016 in [place]  Date  04/19/16  Referring Provider  [name], MD      Visit Diagnosis: NSTEMI (non-ST elevated myocardial infarction) [facility])  Patient's Home Medications on Admission:  Current Outpatient Prescriptions:  .  albuterol (PROVENTIL) (2.5 MG/3ML) 0.083% nebulizer solution, Take 2.5 mg by nebulization every 6 (six) hours as needed for wheezing or shortness of breath., Disp: , Rfl:  .  atorvastatin (LIPITOR) 80 MG tablet, Take 1 tablet (80 mg total) by mouth daily at 6 PM., Disp: 30 tablet, Rfl: 11 .  clopidogrel (PLAVIX) 75 MG tablet, Take 1 tablet (75 mg total) by mouth daily., Disp: 30 tablet, Rfl: 11 .  Fluticasone-Salmeterol (ADVAIR) 250-50 MCG/DOSE AEPB, Inhale 1 puff into the lungs 2 (two) times daily as needed (shortness of breath). Reported on 04/19/2016, Disp: , Rfl:  .  lisinopril (PRINIVIL,ZESTRIL) 2.5 MG tablet, Take 1 tablet (2.5 mg total) by mouth daily., Disp: 30 tablet, Rfl: 11 .  metoprolol succinate (TOPROL-XL) 25 MG 24 hr tablet, Take 1 tablet (25 mg total) by mouth daily., Disp: 30 tablet, Rfl: 11 .  Testosterone (FORTESTA) 10 MG/ACT (2%) GEL, Place 40 mg onto the skin every evening. 4 pumps, Disp: , Rfl:   Past Medical History: Past Medical History:  Diagnosis Date  . Asthma   . CAD in native artery    a. NSTEMI 02/2016 with occ mLAD & occ small PDA both with collaterals, treated medically  . COPD (chronic obstructive pulmonary disease) ([place])   . Hyperlipidemia   . Ischemic cardiomyopathy    a. 02/2016: EF 45-50% by cath,  40-45% by echo.  . Pancreatic lesion     Tobacco Use: History  Smoking Status  . Never Smoker  Smokeless Tobacco  . Never Used    Labs: Recent Review Flowsheet Data    Labs for ITP Cardiac and Pulmonary Rehab Latest Ref Rng & Units 03/07/2016 03/08/2016 04/29/2016   Cholestrol 125 - 200 mg/dL - 182 113(L)   LDLCALC <130 mg/dL - 132(H) 63   HDL >=40 mg/dL - 25(L) 22(L)   Trlycerides <150 mg/dL - 127 139   Hemoglobin A1c 4.8 - 5.6 % 6.0(H) - -      Capillary Blood Glucose: Lab Results  Component Value Date   GLUCAP 74 03/09/2016   GLUCAP 96 03/09/2016   GLUCAP 107 (H) 03/08/2016   GLUCAP 130 (H) 03/07/2016     Exercise Target Goals:    Exercise Program Goal: Individual exercise prescription set with THRR, safety & activity barriers. Participant demonstrates ability to understand and report RPE using BORG scale, to self-measure pulse accurately, and to acknowledge the importance of the exercise prescription.  Exercise Prescription Goal: Starting with aerobic activity 30 plus minutes a day, 3 days per week for initial exercise prescription. Provide home exercise prescription and guidelines that participant acknowledges understanding prior to discharge.  Activity Barriers & Risk Stratification:     Activity Barriers & Cardiac Risk Stratification - 04/19/16 1402      Activity Barriers & Cardiac Risk Stratification   Activity Barriers Neck/Spine Problems   Cardiac Risk Stratification Moderate  6 Minute Walk:     6 Minute Walk    Row Name 04/19/16 1629 07/15/16 1016 07/15/16 1019     6 Minute Walk   Phase Initial Discharge  -   Distance 1617 feet 2156 feet  -   Distance % Change  -  - 33.3 %   Walk Time 6 minutes 6 minutes  -   # of Rest Breaks 0 0  -   MPH 3.06 4.08  -   METS 3.33 4.52  -   RPE 11 12  -   VO2 Peak 11.64 15.84  -   Symptoms No No  -   Resting HR 92 bpm 81 bpm  -   Resting BP 134/64 130/80  -   Max Ex. HR 123 bpm 128 bpm  -   Max Ex. BP  144/62 156/82  -   2 Minute Post BP 128/68 120/60  -      Initial Exercise Prescription:     Initial Exercise Prescription - 04/20/16 0700      Date of Initial Exercise RX and Referring Provider   Date 04/19/16   Referring Provider [name], MD     Treadmill   MPH 2.2   Grade 1   Minutes 10   METs 2.99     Bike   Level 0.9   Minutes 10   METs 2.79     NuStep   Level 2   Minutes 10   METs 2.5     Prescription Details   Frequency (times per week) 3   Duration Progress to 30 minutes of continuous aerobic without signs/symptoms of physical distress     Intensity   THRR 40-80% of Max Heartrate 58-117   Ratings of Perceived Exertion 11-13   Perceived Dyspnea 0-4     Progression   Progression Continue to progress workloads to maintain intensity without signs/symptoms of physical distress.     Resistance Training   Training Prescription Yes   Weight 2 lb.   Reps 10-12      Perform Capillary Blood Glucose checks as needed.  Exercise Prescription Changes:      Exercise Prescription Changes    Row Name 05/05/16 1600 05/11/16 1600 05/18/16 1600 06/03/16 1600 06/16/16 1200     Exercise Review   Progression _0      Response to Exercise   Blood Pressure (Admit) 110/64 124/60 118/70 142/80 120/60   Blood Pressure (Exercise) 128/58 142/60 166/82 160/80 140/70   Blood Pressure (Exit) 108/64 114/54 122/72 118/62 126/62   Heart Rate (Admit) 79 bpm 82 bpm 71 bpm 82 bpm 78 bpm   Heart Rate (Exercise) 137 bpm 136 bpm 127 bpm 134 bpm 130 bpm   Heart Rate (Exit) 86 bpm 86 bpm 82 bpm 93 bpm 90 bpm   Rating of Perceived Exertion (Exercise) _1 Duration Progress to 30 minutes of continuous aerobic without signs/symptoms of physical distress Progress to 30 minutes of continuous aerobic without signs/symptoms of physical distress Progress to 30 minutes of continuous aerobic without signs/symptoms of physical distress Progress to 30  minutes of continuous aerobic without signs/symptoms of physical distress Progress to 30 minutes of continuous aerobic without signs/symptoms of physical distress   Intensity _2      Progression   Progression Continue to progress workloads to maintain intensity without signs/symptoms of physical distress. Continue to progress workloads to maintain  intensity without signs/symptoms of physical distress. Continue to progress workloads to maintain intensity without signs/symptoms of physical distress. Continue to progress workloads to maintain intensity without signs/symptoms of physical distress. Continue to progress workloads to maintain intensity without signs/symptoms of physical distress.   Average METs 3.13 5.3 3.6 3.7 4.1     Resistance Training   Training Prescription _0    Weight 2 lb. 2 lb. 4lbs 4lbs 4lbs   Reps 10-12 10-12 10-12 10-12 10-12     Treadmill   MPH 2.3 2.3 2.3 2.3 2.3   Grade 0 0 0 1 1   Minutes _1 METs 2.76 2.76 2.76 3.08 3.08     Bike   Level 0.9 2 1.5  prev. column was pt working harder than ex rx 1.5  prev. column was pt working harder than ex rx 1.5  prev. column was pt working harder than ex rx   Minutes _2 METs 2.79 2.83 4.02 4.02 4.02     NuStep   Level _3 Minutes _4 METs 3.5 4.1 4 4.2 4.1     Home Exercise Plan   Plans to continue exercise at  - Home  reviewed on 7/19/17l see progress note Home  reviewed on 7/19/17l see progress note Home  reviewed on 7/19/17l see progress note Home  reviewed on 7/19/17l see progress note   Frequency  - Add 2 additional days to program exercise sessions. Add 2 additional days to program exercise sessions. Add 2 additional days to program exercise sessions. Add 2 additional days to program exercise sessions.   Row Name 07/01/16 1000 07/22/16 1015           Exercise Review    Progression Yes Yes        Response to Exercise   Blood Pressure (Admit) 128/64 116/64      Blood Pressure (Exercise) 160/60 146/76      Blood Pressure (Exit) 124/60 124/70      Heart Rate (Admit) 76 bpm 66 bpm      Heart Rate (Exercise) 121 bpm 113 bpm      Heart Rate (Exit) 70 bpm 77 bpm      Rating of Perceived Exertion (Exercise) 11 12      Duration Progress to 30 minutes of continuous aerobic without signs/symptoms of physical distress Progress to 30 minutes of continuous aerobic without signs/symptoms of physical distress      Intensity THRR unchanged THRR unchanged        Progression   Progression Continue to progress workloads to maintain intensity without signs/symptoms of physical distress. Continue to progress workloads to maintain intensity without signs/symptoms of physical distress.      Average METs 4.1 4.6        Resistance Training   Training Prescription Yes Yes      Weight 5lbs 4lbs      Reps 10-12 10-12        Treadmill   MPH 2.3 3      Grade 2 3      Minutes 10 10      METs 3.4 4.54        Bike   Level 1.5  prev. column was pt working harder than ex rx 1.5      Minutes 10 10      METs 4.02 4.02  NuStep   Level 4 6      Minutes 10 10      METs 4.8 5.1        Home Exercise Plan   Plans to continue exercise at Home  reviewed on 7/19/17l see progress note Home      Frequency Add 2 additional days to program exercise sessions. Add 2 additional days to program exercise sessions.         Exercise Comments:      Exercise Comments    Row Name 05/05/16 1622 06/03/16 1628 06/16/16 1208 07/01/16 1042 08/03/16 1014   Exercise Comments Pt is tolerating exercises very well, will continue to monitor exercise progression Reviewed METs and goals. Pt is tolerating exercises very well, will continue to monitor exercise progression Reviewed METs. Pt is tolerating exercises very well, will continue to monitor exercise progression Reviewed METs and goals. Pt is  tolerating exercises very well, will continue to monitor exercise progression Pt completed 36 sessions of cardiac rehab. Pt will walk 30-69mn 5x/week for home exercise.      Discharge Exercise Prescription (Final Exercise Prescription Changes):     Exercise Prescription Changes - 07/22/16 1015      Exercise Review   Progression Yes     Response to Exercise   Blood Pressure (Admit) 116/64   Blood Pressure (Exercise) 146/76   Blood Pressure (Exit) 124/70   Heart Rate (Admit) 66 bpm   Heart Rate (Exercise) 113 bpm   Heart Rate (Exit) 77 bpm   Rating of Perceived Exertion (Exercise) 12   Duration Progress to 30 minutes of continuous aerobic without signs/symptoms of physical distress   Intensity THRR unchanged     Progression   Progression Continue to progress workloads to maintain intensity without signs/symptoms of physical distress.   Average METs 4.6     Resistance Training   Training Prescription Yes   Weight 4lbs   Reps 10-12     Treadmill   MPH 3   Grade 3   Minutes 10   METs 4.54     Bike   Level 1.5   Minutes 10   METs 4.02     NuStep   Level 6   Minutes 10   METs 5.1     Home Exercise Plan   Plans to continue exercise at Home   Frequency Add 2 additional days to program exercise sessions.      Nutrition:  Target Goals: Understanding of nutrition guidelines, daily intake of sodium <15046m cholesterol <20055mcalories 30% from fat and 7% or less from saturated fats, daily to have 5 or more servings of fruits and vegetables.  Biometrics:     Pre Biometrics - 04/19/16 1627      Pre Biometrics   Height 5' 8.25" (1.734 m)   Weight 212 lb 4.9 oz (96.3 kg)   Waist Circumference 40.5 inches   Hip Circumference 44.5 inches   Waist to Hip Ratio 0.91 %   BMI (Calculated) 32.1   Triceps Skinfold 21 mm   % Body Fat 30.9 %   Grip Strength 42 kg   Flexibility 7 in   Single Leg Stand 7.59 seconds         Post Biometrics - 07/15/16 1018       Post   Biometrics   Weight 201 lb 15.1 oz (91.6 kg)   Waist Circumference 39.75 inches   Hip Circumference 43 inches   Waist to Hip Ratio 0.92 %   Triceps Skinfold  20 mm   % Body Fat 29.8 %   Grip Strength 39 kg   Flexibility 10 in   Single Leg Stand 17.46 seconds      Nutrition Therapy Plan and Nutrition Goals:     Nutrition Therapy & Goals - 05/30/16 0924      Nutrition Therapy   Diet Therapeutic Lifestyle Changes     Personal Nutrition Goals   Personal Goal #1 1-2 lb wt loss per week to a goal wt loss of 6-24 lb at graduation from [place], educate and counsel regarding individualized specific dietary modifications aiming towards targeted core components such as weight, hypertension, lipid management, diabetes, heart failure and other comorbidities.;Nutrition handout(s) given to patient.  Pre-diabetes handout given   Expected Outcomes Short Term Goal: Understand basic principles of dietary content, such as calories, fat, sodium, cholesterol and nutrients.;Long Term Goal: Adherence to prescribed nutrition plan.      Nutrition Discharge: Nutrition Scores:     Nutrition Assessments - 08/05/16 0922      MEDFICTS Scores   Pre Score 3   Post Score 21   Score Difference 18      Nutrition Goals Re-Evaluation:     Nutrition Goals Re-Evaluation    Row Name 08/05/16 0915             Personal Goal #1 Re-Evaluation   Personal Goal #1 1-2 lb wt loss per week to a goal wt loss of 6-24 lb at graduation from Cardiac Rehab       Goal Progress Seen Met       Comments Pt wt is down 10.3 lb since admission.         Weight   Current Weight 201 lb 8.3 oz (91.4 kg)          Psychosocial: Target Goals: Acknowledge presence or absence of depression, maximize coping skills, provide positive support system. Participant is able to verbalize types and ability to use techniques and skills needed for reducing stress and  depression.  Initial Review & Psychosocial Screening:     Initial Psych Review & Screening - 04/27/16 0933      Family Dynamics   Good Support System? Yes     Barriers   Psychosocial barriers to participate in program There are no identifiable barriers or psychosocial needs.;The patient should benefit from training in stress management and relaxation.     Screening Interventions   Interventions Encouraged to exercise      Quality of Life Scores:     Quality of Life - 07/21/16 1601      Quality of Life Scores   Health/Function Post 24.7 %   Socioeconomic Post 28.33 %   Psych/Spiritual Post 27.21 %   Family Post 19.3 %   GLOBAL Post 25.08 %      PHQ-9: Recent Review Flowsheet Data    Depression screen [facility] 2/9 07/22/2016 04/27/2016   Decreased Interest 0 0   Down, Depressed, Hopeless 0 0   PHQ - 2 Score 0 0      Psychosocial Evaluation and Intervention:     Psychosocial Evaluation - 07/22/16 0922      Discharge Psychosocial Assessment & Intervention   Comments no psychosocial needs identified, no interventions necessary       Psychosocial Re-Evaluation:     Psychosocial Re-Evaluation    [place] Name 05/30/16 0914 06/24/16 1014 07/05/16 1723         Psychosocial Re-Evaluation  Interventions Stress management education;Relaxation education;Encouraged to attend Cardiac Rehabilitation for the exercise  - Encouraged to attend Cardiac Rehabilitation for the exercise     Comments  - no psychosocial needs identified, no psychosocial interventions necessary. pt is encouraged to exercise and participate in stress management, relaxation to decrease cardiovascular risk factors.  no psychosocial needs identified, no psyshosocial interventions necessary. pt feels he has increased stamina and is able to participate in activity longer.      Continued Psychosocial Services Needed No No No        Vocational Rehabilitation: Provide vocational rehab assistance to qualifying  candidates.   Vocational Rehab Evaluation & Intervention:   Education: Education Goals: Education classes will be provided on a weekly basis, covering required topics. Participant will state understanding/return demonstration of topics presented.  Learning Barriers/Preferences:   Education Topics: Count Your Pulse:  -Group instruction provided by verbal instruction, demonstration, patient participation and written materials to support subject.  Instructors address importance of being able to find your pulse and how to count your pulse when at home without a heart monitor.  Patients get hands on experience counting their pulse with staff help and individually. Flowsheet Row CARDIAC REHAB PHASE II EXERCISE from 07/22/2016 in [place]  Date  06/24/16  Educator  Maurice Small, RN  Instruction Review Code  R- Review/reinforce      Heart Attack, Angina, and Risk Factor Modification:  -Group instruction provided by verbal instruction, video, and written materials to support subject.  Instructors address signs and symptoms of angina and heart attacks.    Also discuss risk factors for heart disease and how to make changes to improve heart health risk factors.   Functional Fitness:  -Group instruction provided by verbal instruction, demonstration, patient participation, and written materials to support subject.  Instructors address safety measures for doing things around the house.  Discuss how to get up and down off the floor, how to pick things up properly, how to safely get out of a chair without assistance, and balance training. Flowsheet Row CARDIAC REHAB PHASE II EXERCISE from 07/22/2016 in [place]  Date  07/08/16  Instruction Review Code  2- meets goals/outcomes      Meditation and Mindfulness:  -Group instruction provided by verbal instruction, patient participation, and written materials to support subject.   Instructor addresses importance of mindfulness and meditation practice to help reduce stress and improve awareness.  Instructor also leads participants through a meditation exercise.  Flowsheet Row CARDIAC REHAB PHASE II EXERCISE from 07/22/2016 in [place]  Date  06/22/16  Educator  RD  Instruction Review Code  2- meets goals/outcomes      Stretching for Flexibility and Mobility:  -Group instruction provided by verbal instruction, patient participation, and written materials to support subject.  Instructors lead participants through series of stretches that are designed to increase flexibility thus improving mobility.  These stretches are additional exercise for major muscle groups that are typically performed during regular warm up and cool down. Flowsheet Row CARDIAC REHAB PHASE II EXERCISE from 07/22/2016 in [place]  Date  07/22/16  Instruction Review Code  2- meets goals/outcomes      Hands Only CPR Anytime:  -Group instruction provided by verbal instruction, video, patient participation and written materials to support subject.  Instructors co-teach with [facility] video for hands only CPR.  Participants get hands on experience with mannequins.   Nutrition  I class: Heart Healthy Eating:  -Group instruction provided by PowerPoint slides, verbal discussion, and written materials to support subject matter. The instructor gives an explanation and review of the Therapeutic Lifestyle Changes diet recommendations, which includes a discussion on lipid goals, dietary fat, sodium, fiber, plant stanol/sterol esters, sugar, and the components of a well-balanced, healthy diet. Flowsheet Row CARDIAC REHAB PHASE II EXERCISE from 07/22/2016 in [place]  Date  05/30/16  Educator  RD  Instruction Review Code  Not applicable [class handout given]      Nutrition II class: Lifestyle Skills:  -Group instruction  provided by PowerPoint slides, verbal discussion, and written materials to support subject matter. The instructor gives an explanation and review of label reading, grocery shopping for heart health, heart healthy recipe modifications, and ways to make healthier choices when eating out. Flowsheet Row CARDIAC REHAB PHASE II EXERCISE from 07/22/2016 in [place]  Date  05/30/16  Educator  RD  Instruction Review Code  Not applicable [class handout given]      Diabetes Question & Answer:  -Group instruction provided by PowerPoint slides, verbal discussion, and written materials to support subject matter. The instructor gives an explanation and review of diabetes co-morbidities, pre- and post-prandial blood glucose goals, pre-exercise blood glucose goals, signs, symptoms, and treatment of hypoglycemia and hyperglycemia, and foot care basics. Flowsheet Row CARDIAC REHAB PHASE II EXERCISE from 07/22/2016 in [place]  Date  07/01/16  Educator  RD  Instruction Review Code  2- meets goals/outcomes      Diabetes Blitz:  -Group instruction provided by PowerPoint slides, verbal discussion, and written materials to support subject matter. The instructor gives an explanation and review of the physiology behind type 1 and type 2 diabetes, diabetes medications and rational behind using different medications, pre- and post-prandial blood glucose recommendations and Hemoglobin A1c goals, diabetes diet, and exercise including blood glucose guidelines for exercising safely.    Portion Distortion:  -Group instruction provided by PowerPoint slides, verbal discussion, written materials, and food models to support subject matter. The instructor gives an explanation of serving size versus portion size, changes in portions sizes over the last 20 years, and what consists of a serving from each food group. Flowsheet Row CARDIAC REHAB PHASE II EXERCISE from  07/22/2016 in [place]  Date  06/01/16  Educator  RD  Instruction Review Code  2- meets goals/outcomes      Stress Management:  -Group instruction provided by verbal instruction, video, and written materials to support subject matter.  Instructors review role of stress in heart disease and how to cope with stress positively.   Flowsheet Row CARDIAC REHAB PHASE II EXERCISE from 07/22/2016 in [place]  Date  05/25/16  Instruction Review Code  2- meets goals/outcomes      Exercising on Your Own:  -Group instruction provided by verbal instruction, power point, and written materials to support subject.  Instructors discuss benefits of exercise, components of exercise, frequency and intensity of exercise, and end points for exercise.  Also discuss use of nitroglycerin and activating EMS.  Review options of places to exercise outside of rehab.  Review guidelines for sex with heart disease. Flowsheet Row CARDIAC REHAB PHASE II EXERCISE from 07/22/2016 in [place]  Date  07/13/16  Instruction Review Code  2- meets goals/outcomes      Cardiac Drugs I:  -  Group instruction provided by verbal instruction and written materials to support subject.  Instructor reviews cardiac drug classes: antiplatelets, anticoagulants, beta blockers, and statins.  Instructor discusses reasons, side effects, and lifestyle considerations for each drug class. Flowsheet Row CARDIAC REHAB PHASE II EXERCISE from 07/22/2016 in [place]  Date  06/08/16  Educator  pharmD   Instruction Review Code  2- meets goals/outcomes      Cardiac Drugs II:  -Group instruction provided by verbal instruction and written materials to support subject.  Instructor reviews cardiac drug classes: angiotensin converting enzyme inhibitors (ACE-I), angiotensin II receptor blockers (ARBs), nitrates, and calcium channel  blockers.  Instructor discusses reasons, side effects, and lifestyle considerations for each drug class. Flowsheet Row CARDIAC REHAB PHASE II EXERCISE from 07/22/2016 in [place]  Date  07/06/16  Instruction Review Code  2- meets goals/outcomes      Anatomy and Physiology of the Circulatory System:  -Group instruction provided by verbal instruction, video, and written materials to support subject.  Reviews functional anatomy of heart, how it relates to various diagnoses, and what role the heart plays in the overall system. Flowsheet Row CARDIAC REHAB PHASE II EXERCISE from 07/22/2016 in [place]  Date  06/29/16  Instruction Review Code  2- meets goals/outcomes      Knowledge Questionnaire Score:     Knowledge Questionnaire Score - 07/21/16 1557      Knowledge Questionnaire Score   Post Score 24/24      Core Components/Risk Factors/Patient Goals at Admission:     Personal Goals and Risk Factors at Admission - 04/19/16 1630      Core Components/Risk Factors/Patient Goals on Admission    Weight Management Obesity   Increase Strength and Stamina Yes   Intervention Provide advice, education, support and counseling about physical activity/exercise needs.;Develop an individualized exercise prescription for aerobic and resistive training based on initial evaluation findings, risk stratification, comorbidities and participant's personal goals.   Expected Outcomes Achievement of increased cardiorespiratory fitness and enhanced flexibility, muscular endurance and strength shown through measurements of functional capacity and personal statement of participant.   Lipids Yes   Intervention Provide education and support for participant on nutrition & aerobic/resistive exercise along with prescribed medications to achieve LDL <61m, HDL >460m   Expected Outcomes Short Term: Participant states understanding of desired cholesterol  values and is compliant with medications prescribed. Participant is following exercise prescription and nutrition guidelines.;Long Term: Cholesterol controlled with medications as prescribed, with individualized exercise RX and with personalized nutrition plan. Value goals: LDL < 7065mHDL > 40 mg.      Core Components/Risk Factors/Patient Goals Review:      Goals and Risk Factor Review    Row Name 06/03/16 1628 06/29/16 1213 08/05/16 0916         Core Components/Risk Factors/Patient Goals Review   Personal Goals Review Other;Increase Strength and Stamina Increase Strength and Stamina Weight Management/Obesity     Review Strength needs improvement but heading in the right direction. Energy levels have increased Slowly gaining muscle mass. Discussed increasing reps and sets. Also adding an addition days of strength training (Wednesdays) Pt wt is down 10.3 lb. Wt loss goal met.      Expected Outcomes Pt energy levels and strength will continue improve  Pt will increase muscle tone Continue to encourage wt maintainenance or continued slow wt loss of 1-2 lb per week until goal wt achieved.  Core Components/Risk Factors/Patient Goals at Discharge (Final Review):      Goals and Risk Factor Review - 08/05/16 0916      Core Components/Risk Factors/Patient Goals Review   Personal Goals Review Weight Management/Obesity   Review Pt wt is down 10.3 lb. Wt loss goal met.    Expected Outcomes Continue to encourage wt maintainenance or continued slow wt loss of 1-2 lb per week until goal wt achieved.      ITP Comments:     ITP Comments    Row Name 04/19/16 1349           ITP Comments Medical Director - Dr. Fransico Him, MD          Comments: Pt graduated from cardiac rehab program today with completion of 36 exercise sessions in Phase II. Pt maintained good attendance and progressed nicely during his participation in rehab as evidenced by increased MET level.   Medication list  reconciled. Repeat  PHQ score- 0 .  Pt has made significant lifestyle changes and should be commended for his success. Pt does not  feel he has achieved his goals during cardiac rehab, because he continues to have lower back pain prohibiting him from performing heavy labor activities he would like to participate in.  Pt is also concerned about continued weight loss and has scheduled appointment with his gastroenterologist for evaluation.    Pt plans to continue exercising on his own with manual labor. Pt encouraged to incorporate separate aerobic activity into his routine.  Understanding verbalized

## 2016-07-25 ENCOUNTER — Encounter (HOSPITAL_COMMUNITY): Payer: Medicare Other

## 2016-07-27 ENCOUNTER — Encounter (HOSPITAL_COMMUNITY): Payer: Medicare Other

## 2016-07-29 ENCOUNTER — Encounter (HOSPITAL_COMMUNITY): Payer: Medicare Other

## 2016-08-01 ENCOUNTER — Encounter (HOSPITAL_COMMUNITY): Payer: Medicare Other

## 2016-08-03 ENCOUNTER — Encounter (HOSPITAL_COMMUNITY): Payer: Medicare Other

## 2016-08-05 ENCOUNTER — Encounter (HOSPITAL_COMMUNITY): Payer: Medicare Other

## 2016-08-08 ENCOUNTER — Encounter (HOSPITAL_COMMUNITY): Payer: Medicare Other

## 2016-08-10 ENCOUNTER — Encounter (HOSPITAL_COMMUNITY): Payer: Medicare Other

## 2016-08-12 ENCOUNTER — Encounter (HOSPITAL_COMMUNITY): Payer: Medicare Other

## 2016-08-15 ENCOUNTER — Encounter (HOSPITAL_COMMUNITY): Payer: Medicare Other

## 2016-08-17 ENCOUNTER — Encounter (HOSPITAL_COMMUNITY): Payer: Medicare Other

## 2016-08-19 ENCOUNTER — Encounter (HOSPITAL_COMMUNITY): Payer: Medicare Other

## 2016-08-22 ENCOUNTER — Encounter (HOSPITAL_COMMUNITY): Payer: Medicare Other

## 2016-08-24 ENCOUNTER — Encounter (HOSPITAL_COMMUNITY): Payer: Medicare Other

## 2016-08-25 ENCOUNTER — Other Ambulatory Visit: Payer: Self-pay | Admitting: Gastroenterology

## 2016-08-25 DIAGNOSIS — K862 Cyst of pancreas: Secondary | ICD-10-CM

## 2016-08-25 DIAGNOSIS — R634 Abnormal weight loss: Secondary | ICD-10-CM

## 2016-08-26 ENCOUNTER — Ambulatory Visit
Admission: RE | Admit: 2016-08-26 | Discharge: 2016-08-26 | Disposition: A | Discharge status: Home / Self Care | Payer: Medicare Other | Source: Ambulatory Visit | Attending: Gastroenterology | Admitting: Gastroenterology

## 2016-08-26 DIAGNOSIS — R634 Abnormal weight loss: Secondary | ICD-10-CM

## 2016-08-26 DIAGNOSIS — K862 Cyst of pancreas: Secondary | ICD-10-CM

## 2016-08-26 MED ORDER — GADOBENATE DIMEGLUMINE 529 MG/ML IV SOLN
18.0000 mL | Freq: Once | INTRAVENOUS | Status: AC | PRN
  Administered 2016-08-26: 18 mL via INTRAVENOUS

## 2016-12-23 ENCOUNTER — Ambulatory Visit (INDEPENDENT_AMBULATORY_CARE_PROVIDER_SITE_OTHER): Payer: Medicare Other | Admitting: Cardiovascular Disease

## 2016-12-23 ENCOUNTER — Encounter: Payer: Self-pay | Admitting: Cardiovascular Disease

## 2016-12-23 VITALS — BP 132/74 | HR 69 | Ht 69.0 in | Wt 204.2 lb

## 2016-12-23 DIAGNOSIS — I255 Ischemic cardiomyopathy: Secondary | ICD-10-CM | POA: Diagnosis not present

## 2016-12-23 DIAGNOSIS — I2510 Atherosclerotic heart disease of native coronary artery without angina pectoris: Secondary | ICD-10-CM

## 2016-12-23 DIAGNOSIS — E7800 Pure hypercholesterolemia, unspecified: Secondary | ICD-10-CM | POA: Diagnosis not present

## 2016-12-23 MED ORDER — ATORVASTATIN CALCIUM 80 MG PO TABS: 80.0000 mg | ORAL_TABLET | Freq: Every day | ORAL | 11 refills | Status: DC

## 2016-12-23 MED ORDER — LOSARTAN POTASSIUM 50 MG PO TABS: 50.0000 mg | ORAL_TABLET | Freq: Every day | ORAL | 11 refills | Status: DC

## 2016-12-23 NOTE — Progress Notes (Signed)
Chief Complaint  Patient presents with  . Follow-up    6 months ....c/o chest pain     History of Present Illness: 76 yo male with history of COPD, asthma, hyperlipidemia, CAD, ischemic cardiomyopathy who is here today for follow up. He was admitted to [facility] 03/07/16 with chest pain and dyspnea. He ruled in for NSTEMI (peak trop 10.68) and underwent cardiac cath on 03/07/16 per Dr. Martinique. This showed 100% occlusion of the mid LAD and 100% occlusion of the small PDA, both vessels filling from collaterals. His LAD occlusion was felt to be more than 12 hours from presentation so the plan was for medical management. LVEF=40-45% by echo. He was not started on ASA due to intolerance. He was discharged on Plavix, statin and beta blocker.     He is here today for follow up. No chest pain or SOB. He has been doing well but he has had continued fatigue. No syncope. He has a dry cough.   Primary Care Physician: Philis Fendt, MD   Past Medical History:  Diagnosis Date  . Asthma   . CAD in native artery    a. NSTEMI 02/2016 with occ mLAD & occ small PDA both with collaterals, treated medically  . COPD (chronic obstructive pulmonary disease) ([place])   . Hyperlipidemia   . Ischemic cardiomyopathy    a. 02/2016: EF 45-50% by cath, 40-45% by echo.  . Pancreatic lesion     Past Surgical History:  Procedure Laterality Date  . CARDIAC CATHETERIZATION N/A 03/07/2016   Procedure: Left Heart Cath and Coronary Angiography;  Surgeon: [name], MD;  Location: [place] [facility];  Service: Cardiovascular;  Laterality: N/A;    Current Outpatient Prescriptions  Medication Sig Dispense Refill  . albuterol (PROVENTIL) (2.5 MG/3ML) 0.083% nebulizer solution Take 2.5 mg by nebulization every 6 (six) hours as needed for wheezing or shortness of breath.    . clopidogrel (PLAVIX) 75 MG tablet Take 1 tablet (75 mg total) by mouth daily. 30 tablet 11  . Fluticasone-Salmeterol (ADVAIR) 250-50 MCG/DOSE AEPB  Inhale 1 puff into the lungs 2 (two) times daily as needed (shortness of breath). Reported on 04/19/2016    . metoprolol succinate (TOPROL-XL) 25 MG 24 hr tablet Take 12.5 mg by mouth daily.    . Testosterone (FORTESTA) 10 MG/ACT (2%) GEL Place 40 mg onto the skin every evening. 4 pumps    . atorvastatin (LIPITOR) 80 MG tablet Take 1 tablet (80 mg total) by mouth daily at 6 PM. 30 tablet 11  . losartan (COZAAR) 50 MG tablet Take 1 tablet (50 mg total) by mouth daily. 30 tablet 11   No current facility-administered medications for this visit.     Allergies  Allergen Reactions  . Nsaids Shortness Of Breath  . Isosorbide Mononitrate [Isosorbide Dinitrate Er] Other (See Comments)    Severe migraine headache  . Nitroglycerin Other (See Comments)    Pt reported BP dropped very low when he took oral NTG  . Lisinopril Cough    Social History   Social History  . Marital status: Married    Spouse name: N/A  . Number of children: N/A  . Years of education: N/A   Occupational History  . Retired Nature conservation officer and [employer]    Social History Main Topics  . Smoking status: Never Smoker  . Smokeless tobacco: Never Used  . Alcohol use No  . Drug use: No  . Sexual activity: Not on file   Other  Topics Concern  . Not on file   Social History Narrative   Lives in [place] with wife    Family History  Problem Relation Age of Onset  . Cancer Father     Review of Systems:  As stated in the HPI and otherwise negative.   BP 132/74 (BP Location: Left Arm)   Pulse 69   Ht 5\' 9"  (1.753 m)   Wt 204 lb 3.2 oz (92.6 kg)   BMI 30.16 kg/m   Physical Examination: General: Well developed, well nourished, NAD  HEENT: OP clear, mucus membranes moist  SKIN: warm, dry. No rashes. Neuro: No focal deficits  Musculoskeletal: Muscle strength 5/5 all ext  Psychiatric: Mood and affect normal  Neck: No JVD, no carotid bruits, no thyromegaly, no lymphadenopathy.  Lungs:Clear bilaterally, no wheezes, rhonci,  crackles Cardiovascular: Regular rate and rhythm. No murmurs, gallops or rubs. Abdomen:Soft. Bowel sounds present. Non-tender.  Extremities: No lower extremity edema. Pulses are 2 + in the bilateral DP/PT.  Cardiac cath 03/07/16:  Ost RPDA lesion, 100% stenosed.  Mid LAD lesion, 100% stenosed.  There is mild to moderate left ventricular systolic dysfunction.   1. 2 vessel occlusive CAD.    -100% mid LAD after the second diagonal.     -100% flush occlusion of the PDA 2. Mild to moderate LV dysfunction  Echo 03/07/16: Left ventricle: The cavity size was normal. Systolic function was   mildly to moderately reduced. The estimated ejection fraction was   in the range of 40% to 45%. Wall motion was normal; there were no   regional wall motion abnormalities. Doppler parameters are   consistent with abnormal left ventricular relaxation (grade 1   diastolic dysfunction). There was no evidence of elevated   ventricular filling pressure by Doppler parameters. - Aortic valve: Trileaflet; mildly thickened, mildly calcified   leaflets. There was no regurgitation. - Aortic root: The aortic root was normal in size. - Mitral valve: Calcified annulus. There was no regurgitation. - Left atrium: The atrium was mildly dilated. - Right ventricle: Systolic function was normal. - Tricuspid valve: There was trivial regurgitation. - Pulmonary arteries: Systolic pressure was within the normal   range. - Inferior vena cava: The vessel was normal in size. - Pericardium, extracardiac: There was no pericardial effusion.  Impressions:  - Wall motion abnormalities suspicious for hibernation or infarct   in the LAD terrotory.  EKG:  EKG is ordered today. The ekg ordered today demonstrates NSR, rate 69  Recent Labs: 03/07/2016: TSH 1.988 03/10/2016: Hemoglobin 14.3; Platelets 177 04/29/2016: ALT 36; BUN 13; Creat 0.83; Potassium 4.3; Sodium 136   Lipid Panel    Component Value Date/Time   CHOL 113 (L)  04/29/2016 1004   TRIG 139 04/29/2016 1004   HDL 22 (L) 04/29/2016 1004   CHOLHDL 5.1 (H) 04/29/2016 1004   VLDL 28 04/29/2016 1004   LDLCALC 63 04/29/2016 1004     Wt Readings from Last 3 Encounters:  12/23/16 204 lb 3.2 oz (92.6 kg)  07/15/16 201 lb 15.1 oz (91.6 kg)  06/23/16 208 lb 12.8 oz (94.7 kg)     Other studies Reviewed: Additional studies/ records that were reviewed today include: . Review of the above records demonstrates:   Assessment and Plan:   1. CAD without angina: He is having no chest pain suggestive of angina. Will continue Plavix, beta blocker and statin. He is not on ASA due to intolerance. Beta blocker dose reduced in primary care due to weakness.  2. Ischemic cardiomyopathy: Will continue beta blocker. Will change Lisinopril to Cozaar due to dry cough. Repeat echo now.  3. Hyperlipidemia: Will continue statin. LDL at goal and followed in primary care.    Current medicines are reviewed at length with the patient today.  The patient does not have concerns regarding medicines.  The following changes have been made:  no change  Labs/ tests ordered today include:   Orders Placed This Encounter  Procedures  . EKG 12-Lead  . ECHOCARDIOGRAM COMPLETE    Disposition:   FU with me in 6 months  Signed, [name], MD 12/23/2016 10:34 AM    [facility] [address], [place], [place]  [address] Phone: [phone]; Fax: [phone]

## 2016-12-23 NOTE — Patient Instructions (Signed)
Medication Instructions:  Your physician has recommended you make the following change in your medication:  Stop lisinopril. Start Cozaar 50 mg by mouth daily.    Labwork: none  Testing/Procedures: Your physician has requested that you have an echocardiogram. Echocardiography is a painless test that uses sound waves to create images of your heart. It provides your doctor with information about the size and shape of your heart and how well your heart's chambers and valves are working. This procedure takes approximately one hour. There are no restrictions for this procedure.    Follow-Up: Your physician recommends that you schedule a follow-up appointment in: 6 months. Please call our office in about 3 months to schedule this appointment.     Any Other Special Instructions Will Be Listed Below (If Applicable).     If you need a refill on your cardiac medications before your next appointment, please call your pharmacy.

## 2017-01-11 ENCOUNTER — Other Ambulatory Visit: Payer: Self-pay

## 2017-01-11 ENCOUNTER — Ambulatory Visit (HOSPITAL_COMMUNITY): Payer: Medicare Other | Attending: Internal Medicine

## 2017-01-11 DIAGNOSIS — I255 Ischemic cardiomyopathy: Secondary | ICD-10-CM | POA: Insufficient documentation

## 2017-01-11 DIAGNOSIS — I252 Old myocardial infarction: Secondary | ICD-10-CM | POA: Insufficient documentation

## 2017-01-11 DIAGNOSIS — J449 Chronic obstructive pulmonary disease, unspecified: Secondary | ICD-10-CM | POA: Diagnosis not present

## 2017-01-11 DIAGNOSIS — E785 Hyperlipidemia, unspecified: Secondary | ICD-10-CM | POA: Diagnosis not present

## 2017-01-11 DIAGNOSIS — I2510 Atherosclerotic heart disease of native coronary artery without angina pectoris: Secondary | ICD-10-CM | POA: Diagnosis not present

## 2017-01-11 MED ORDER — PERFLUTREN LIPID MICROSPHERE
1.0000 mL | INTRAVENOUS | Status: AC | PRN
  Administered 2017-01-11: 2 mL via INTRAVENOUS
  Administered 2017-01-11: 1 mL via INTRAVENOUS

## 2017-03-30 ENCOUNTER — Telehealth: Payer: Self-pay | Admitting: Cardiovascular Disease

## 2017-03-30 NOTE — Telephone Encounter (Signed)
New message    Request for surgical clearance:  What type of surgery is being performed? Upper EUS-   1. When is this surgery scheduled? 6.19.2018   2. Are there any medications that need to be held prior to surgery and how long?plavix - need an answer by Friday  6/8 .   3. Name of physician performing surgery? Dr. Alphonzo Severance  4. What is your office phone and fax number? Fax # [phone] / phone # [phone]

## 2017-03-30 NOTE — Telephone Encounter (Signed)
[place]ell for patient to hold Plavix 5 days before his planned procedure.   [name] 03/30/2017 2:58 PM

## 2017-03-30 NOTE — Telephone Encounter (Signed)
Faxed to requesting office. 

## 2017-05-19 ENCOUNTER — Ambulatory Visit (INDEPENDENT_AMBULATORY_CARE_PROVIDER_SITE_OTHER): Payer: Medicare Other | Admitting: Cardiovascular Disease

## 2017-05-19 ENCOUNTER — Encounter (INDEPENDENT_AMBULATORY_CARE_PROVIDER_SITE_OTHER): Payer: Self-pay

## 2017-05-19 ENCOUNTER — Encounter: Payer: Self-pay | Admitting: Cardiovascular Disease

## 2017-05-19 VITALS — BP 126/68 | HR 71 | Ht 69.0 in | Wt 197.1 lb

## 2017-05-19 DIAGNOSIS — Z01810 Encounter for preprocedural cardiovascular examination: Secondary | ICD-10-CM

## 2017-05-19 DIAGNOSIS — E7800 Pure hypercholesterolemia, unspecified: Secondary | ICD-10-CM

## 2017-05-19 DIAGNOSIS — I255 Ischemic cardiomyopathy: Secondary | ICD-10-CM | POA: Diagnosis not present

## 2017-05-19 DIAGNOSIS — I2510 Atherosclerotic heart disease of native coronary artery without angina pectoris: Secondary | ICD-10-CM

## 2017-05-19 NOTE — Progress Notes (Signed)
Chief Complaint  Patient presents with  . Follow-up     History of Present Illness: 76 yo male with history of COPD, asthma, hyperlipidemia, CAD, ischemic cardiomyopathy who is here today for follow up. He was admitted to [facility] 03/07/16 with a NSTEMI and underwent cardiac cath on 03/07/16 per Dr. Martinique. This showed 100% occlusion of the mid LAD and 100% occlusion of the small PDA, both vessels filling from collaterals. His LAD occlusion was felt to be more than 12 hours from presentation so the plan was for medical management. LVEF=40-45% by echo. He was not started on ASA due to intolerance.  Repeat echo March 2018 with LVEF=50-55%.   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 The patient tells me the surgeons at [facility] want to perform a pancreaticoduodenectomy over the next few months. The patient denies any chest pain, palpitations, lower extremity edema, orthopnea, PND, dizziness, near syncope or syncope. He does endorse baseline dyspnea which has been unchanged for several years. He has no change in baseline fatigue. He has been working on his roof all week. He has been cutting trees in his back yard this week as well. He actually feels great. The records indicate that his oncology surgeons at [facility] feel strongly that he have a stress test before setting a date for his surgery.    Primary Care Physician: [name], MD   Past Medical History:  Diagnosis Date  . Asthma   . CAD in native artery    a. NSTEMI 02/2016 with occ mLAD & occ small PDA both with collaterals, treated medically  . COPD (chronic obstructive pulmonary disease) ([place])   . Hyperlipidemia   . Ischemic cardiomyopathy    a. 02/2016: EF 45-50% by cath, 40-45% by echo.  . Pancreatic lesion     Past Surgical History:  Procedure Laterality Date  . CARDIAC CATHETERIZATION N/A 03/07/2016   Procedure: Left Heart Cath  and Coronary Angiography;  Surgeon: [name], MD;  Location: [place] [facility];  Service: Cardiovascular;  Laterality: N/A;    Current Outpatient Prescriptions  Medication Sig Dispense Refill  . albuterol (PROVENTIL) (2.5 MG/3ML) 0.083% nebulizer solution Take 2.5 mg by nebulization every 6 (six) hours as needed for wheezing or shortness of breath.    Marland Kitchen atorvastatin (LIPITOR) 80 MG tablet Take 1 tablet (80 mg total) by mouth daily at 6 PM. 30 tablet 11  . clopidogrel (PLAVIX) 75 MG tablet Take 1 tablet (75 mg total) by mouth daily. 30 tablet 11  . Fluticasone-Salmeterol (ADVAIR) 250-50 MCG/DOSE AEPB Inhale 1 puff into the lungs 2 (two) times daily as needed (shortness of breath). Reported on 04/19/2016    . metoprolol succinate (TOPROL-XL) 25 MG 24 hr tablet Take 12.5 mg by mouth daily.    . Testosterone (FORTESTA) 10 MG/ACT (2%) GEL Place 40 mg onto the skin every evening. 4 pumps    . losartan (COZAAR) 50 MG tablet Take 1 tablet (50 mg total) by mouth daily. 30 tablet 11   No current facility-administered medications for this visit.     Allergies  Allergen Reactions  . Nsaids Shortness Of Breath  . Tolmetin Shortness Of Breath  . Isosorbide Dinitrate     Other reaction(s): Other (See Comments) Severe migraine headache  . Isosorbide Mononitrate [Isosorbide Dinitrate Er] Other (See Comments)    Severe migraine headache  . Nitroglycerin Other (See Comments)  Pt reported BP dropped very low when he took oral NTG  . Lisinopril Cough    Social History   Social History  . Marital status: Married    Spouse name: N/A  . Number of children: N/A  . Years of education: N/A   Occupational History  . Retired Nature conservation officer and [employer]    Social History Main Topics  . Smoking status: Never Smoker  . Smokeless tobacco: Never Used  . Alcohol use No  . Drug use: No  . Sexual activity: Not on file   Other Topics Concern  . Not on file   Social History Narrative   Lives in [place] with  wife    Family History  Problem Relation Age of Onset  . Cancer Father     Review of Systems:  As stated in the HPI and otherwise negative.   BP 126/68   Pulse 71   Ht 5\' 9"  (1.753 m)   Wt 197 lb 1.9 oz (89.4 kg)   SpO2 95%   BMI 29.11 kg/m   Physical Examination: General: Well developed, well nourished, NAD  HEENT: OP clear, mucus membranes moist  SKIN: warm, dry. No rashes. Neuro: No focal deficits  Musculoskeletal: Muscle strength 5/5 all ext  Psychiatric: Mood and affect normal  Neck: No JVD, no carotid bruits, no thyromegaly, no lymphadenopathy.  Lungs:Clear bilaterally, no wheezes, rhonci, crackles Cardiovascular: Regular rate and rhythm. No murmurs, gallops or rubs. Abdomen:Soft. Bowel sounds present. Non-tender.  Extremities: No lower extremity edema. Pulses are 2 + in the bilateral DP/PT.  Cardiac cath 03/07/16:  Ost RPDA lesion, 100% stenosed.  Mid LAD lesion, 100% stenosed.  There is mild to moderate left ventricular systolic dysfunction.   1. 2 vessel occlusive CAD.    -100% mid LAD after the second diagonal.     -100% flush occlusion of the PDA 2. Mild to moderate LV dysfunction  Echo March 2018: - Left ventricle: The cavity size was normal. Wall thickness was   increased in a pattern of moderate LVH. Systolic function was   normal. The estimated ejection fraction was in the range of 50%   to 55%. Mild anteroseptal hypokinesis. Doppler parameters are   consistent with abnormal left ventricular relaxation (grade 1   diastolic dysfunction). The E/e&' ratio is between 8-15,   suggesting indeterminate LV filling pressure. - Mitral valve: Calcified annulus. Mildly thickened leaflets .   There was trivial regurgitation. - Left atrium: The atrium was normal in size. - Right atrium: The atrium was mildly dilated. - Inferior vena cava: The vessel was normal in size. The   respirophasic diameter changes were in the normal range (>= 50%),   consistent with  normal central venous pressure.  Impressions:  - Compared to a prior study in 02/2016, the LVEF has improved to   50-55% (although there is still mild anteroseptal hypokinesis).   EKG:  EKG is  ordered today. The ekg ordered today demonstrates NSR, rate 66 bpm. No ischemic changes  Recent Labs: No results found for requested labs within last 8760 hours.   Lipid Panel    Component Value Date/Time   CHOL 113 (L) 04/29/2016 1004   TRIG 139 04/29/2016 1004   HDL 22 (L) 04/29/2016 1004   CHOLHDL 5.1 (H) 04/29/2016 1004   VLDL 28 04/29/2016 1004   LDLCALC 63 04/29/2016 1004     Wt Readings from Last 3 Encounters:  05/19/17 197 lb 1.9 oz (89.4 kg)  12/23/16 204 lb 3.2 oz (  92.6 kg)  07/15/16 201 lb 15.1 oz (91.6 kg)     Other studies Reviewed: Additional studies/ records that were reviewed today include: . Review of the above records demonstrates:   Assessment and Plan:   1. CAD without angina: He is having no chest pain worrisome for angina. He has baseline dyspnea. Since he is planning a major GI surgery, he is requesting a stress test. As above, his surgeons have indicated that this is necessary. He is doing well. He does have significant CAD but there were no options for revascularization at last cath in may 2017. He has a chronic occlusion of the LAD and small branch of the PDA. His stress images will likely show some degree of abnormality given his chronic disease.  -Will arrange exercise nuclear stress test. If there are perfusion abnormalities that are not c/w chronic occlusions, will need cardiac cath.  -Will continue Plavix, statin and beta blocker. No ASA due to intolerance in the past.    2. Ischemic cardiomyopathy: LVEF=50-55% by echo march 2018. Continue beta blocker and ARB.   3. Hyperlipidemia: Continue statin. Lipids controlled.   4. Pre-operative cardiovascular examination: see #1 above.    Current medicines are reviewed at length with the patient today.   The patient does not have concerns regarding medicines.  The following changes have been made:  no change  Labs/ tests ordered today include:   No orders of the defined types were placed in this encounter.   Disposition:   FU with me in 6 months  Signed, [name], MD 05/19/2017 1:46 PM    [facility] [address], [place], [place]  [address] Phone: [phone]; Fax: [phone]

## 2017-05-19 NOTE — Patient Instructions (Signed)
Medication Instructions:  Your physician recommends that you continue on your current medications as directed. Please refer to the Current Medication list given to you today.   Labwork:  Your physician recommends that you return for lab work on day of stress test.  This will be fasting.    Testing/Procedures: Your physician has requested that you have an exercise stress myoview. For further information please visit [email or web address]. Please follow instruction sheet, as given.   Follow-Up: Your physician recommends that you schedule a follow-up appointment in:6 months. Please call our office in about 3 months to schedule this appointment.   Any Other Special Instructions Will Be Listed Below (If Applicable).     If you need a refill on your cardiac medications before your next appointment, please call your pharmacy.

## 2017-05-22 ENCOUNTER — Telehealth (HOSPITAL_COMMUNITY): Payer: Self-pay | Admitting: *Deleted

## 2017-05-22 NOTE — Telephone Encounter (Signed)
Patient given detailed instructions per Myocardial Perfusion Study Information Sheet for the test on 05/24/17. Patient notified to arrive 15 minutes early and that it is imperative to arrive on time for appointment to keep from having the test rescheduled.  If you need to cancel or reschedule your appointment, please call the office within 24 hours of your appointment. . Patient verbalized understanding. Vada Swift Jacqueline    

## 2017-05-24 ENCOUNTER — Ambulatory Visit (HOSPITAL_COMMUNITY): Payer: Medicare Other | Attending: Cardiology

## 2017-05-24 ENCOUNTER — Ambulatory Visit: Payer: Medicare Other | Admitting: *Deleted

## 2017-05-24 DIAGNOSIS — I255 Ischemic cardiomyopathy: Secondary | ICD-10-CM | POA: Diagnosis not present

## 2017-05-24 DIAGNOSIS — I2510 Atherosclerotic heart disease of native coronary artery without angina pectoris: Secondary | ICD-10-CM

## 2017-05-24 DIAGNOSIS — R9439 Abnormal result of other cardiovascular function study: Secondary | ICD-10-CM | POA: Insufficient documentation

## 2017-05-24 DIAGNOSIS — R0600 Dyspnea, unspecified: Secondary | ICD-10-CM | POA: Insufficient documentation

## 2017-05-24 DIAGNOSIS — I10 Essential (primary) hypertension: Secondary | ICD-10-CM | POA: Diagnosis not present

## 2017-05-24 DIAGNOSIS — Z01810 Encounter for preprocedural cardiovascular examination: Secondary | ICD-10-CM

## 2017-05-24 DIAGNOSIS — E785 Hyperlipidemia, unspecified: Secondary | ICD-10-CM | POA: Insufficient documentation

## 2017-05-24 DIAGNOSIS — E7800 Pure hypercholesterolemia, unspecified: Secondary | ICD-10-CM

## 2017-05-24 LAB — LIPID PANEL
CHOL/HDL RATIO: 3.4 ratio (ref 0.0–5.0)
CHOLESTEROL TOTAL: 123 mg/dL (ref 100–199)
HDL: 36 mg/dL — ABNORMAL LOW (ref >39)
LDL CALC: 73 mg/dL (ref 0–99)
TRIGLYCERIDES: 68 mg/dL (ref 0–149)
VLDL CHOLESTEROL CAL: 14 mg/dL (ref 5–40)

## 2017-05-24 LAB — MYOCARDIAL PERFUSION IMAGING
CHL CUP RESTING HR STRESS: 65 {beats}/min
LHR: 0.32
LVDIAVOL: 147 mL (ref 62–150)
LVSYSVOL: 77 mL
NUC STRESS TID: 1.05
Peak HR: 89 {beats}/min
SDS: 11
SRS: 7
SSS: 18

## 2017-05-24 LAB — HEPATIC FUNCTION PANEL
ALT: 16 IU/L (ref 0–44)
AST: 21 IU/L (ref 0–40)
Albumin: 4 g/dL (ref 3.5–4.8)
Alkaline Phosphatase: 57 IU/L (ref 39–117)
Bilirubin Total: 0.6 mg/dL (ref 0.0–1.2)
Bilirubin, Direct: 0.17 mg/dL (ref 0.00–0.40)
Total Protein: 6.4 g/dL (ref 6.0–8.5)

## 2017-05-24 MED ORDER — REGADENOSON 0.4 MG/5ML IV SOLN
0.4000 mg | Freq: Once | INTRAVENOUS | Status: AC
  Administered 2017-05-24: 0.4 mg via INTRAVENOUS

## 2017-05-24 MED ORDER — TECHNETIUM TC 99M TETROFOSMIN IV KIT
10.7000 | PACK | Freq: Once | INTRAVENOUS | Status: AC | PRN
  Administered 2017-05-24: 10.7 via INTRAVENOUS
  Filled 2017-05-24: qty 11

## 2017-05-24 MED ORDER — TECHNETIUM TC 99M TETROFOSMIN IV KIT
33.0000 | PACK | Freq: Once | INTRAVENOUS | Status: AC | PRN
  Administered 2017-05-24: 33 via INTRAVENOUS
  Filled 2017-05-24: qty 33

## 2017-05-25 ENCOUNTER — Telehealth: Payer: Self-pay | Admitting: Cardiovascular Disease

## 2017-05-25 NOTE — Telephone Encounter (Signed)
Pt is aware and agreeable to results. Patient agreed to take Thursday 06/01/17 appt at 11 am with Dr. Angelena Form. Patient is aware that Dr. Angelena Form wants to discuss a heart cath prior to having pancreatic surgery and is agreeable. Appt is made.

## 2017-05-25 NOTE — Telephone Encounter (Signed)
Frank Ayala is returning a call . Please call

## 2017-05-30 ENCOUNTER — Ambulatory Visit: Payer: Medicare Other | Admitting: Physician Assistant

## 2017-06-01 ENCOUNTER — Encounter: Payer: Self-pay | Admitting: Cardiovascular Disease

## 2017-06-01 ENCOUNTER — Encounter: Payer: Self-pay | Admitting: *Deleted

## 2017-06-01 ENCOUNTER — Ambulatory Visit (INDEPENDENT_AMBULATORY_CARE_PROVIDER_SITE_OTHER): Payer: Medicare Other | Admitting: Cardiovascular Disease

## 2017-06-01 VITALS — BP 110/60 | HR 65 | Ht 69.0 in | Wt 196.8 lb

## 2017-06-01 DIAGNOSIS — E7800 Pure hypercholesterolemia, unspecified: Secondary | ICD-10-CM | POA: Diagnosis not present

## 2017-06-01 DIAGNOSIS — I255 Ischemic cardiomyopathy: Secondary | ICD-10-CM | POA: Diagnosis not present

## 2017-06-01 DIAGNOSIS — I2510 Atherosclerotic heart disease of native coronary artery without angina pectoris: Secondary | ICD-10-CM | POA: Diagnosis not present

## 2017-06-01 NOTE — Patient Instructions (Signed)
Medication Instructions:  Your physician recommends that you continue on your current medications as directed. Please refer to the Current Medication list given to you today.   Labwork: Lab work to be done today--BMP, CBC, PT  Testing/Procedures: Your physician has requested that you have a cardiac catheterization. Cardiac catheterization is used to diagnose and/or treat various heart conditions. Doctors may recommend this procedure for a number of different reasons. The most common reason is to evaluate chest pain. Chest pain can be a symptom of coronary artery disease (CAD), and cardiac catheterization can show whether plaque is narrowing or blocking your heart's arteries. This procedure is also used to evaluate the valves, as well as measure the blood flow and oxygen levels in different parts of your heart. For further information please visit [email or web address]. Please follow instruction sheet, as given.  Scheduled for August 17,2018  Follow-Up: Your physician recommends that you schedule a follow-up appointment in: one month with PA or NP    Any Other Special Instructions Will Be Listed Below (If Applicable).     If you need a refill on your cardiac medications before your next appointment, please call your pharmacy.

## 2017-06-01 NOTE — Progress Notes (Signed)
Chief Complaint  Patient presents with  . Follow-up    CAD     History of Present Illness: 76 yo male with history of COPD, asthma, hyperlipidemia, CAD, ischemic cardiomyopathy who is here today for follow up. He was admitted to [facility] 03/07/16 with a NSTEMI and underwent cardiac cath on 03/07/16 per Dr. Martinique. This showed 100% occlusion of the mid LAD and 100% occlusion of the small PDA, both vessels filling from collaterals. His LAD occlusion was felt to be more than 12 hours from presentation so the plan was for medical management. LVEF=40-45% by echo. He was not started on ASA due to intolerance.  Repeat echo March 2018 with LVEF=50-55%. 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 He has been very active and has been doing yard work and roof work. He has had no chest pain but a stress test was recommended prior to his planned surgical procedure given his extensive CAD. As expected, his nuclear stress test was abnormal. His nuclear stress test showed a reversible defect in the mid anteroseptal/mid inferoseptal and true apical defect, possibly due to ischemia.   He is here today for follow up. The patient denies any chest pain, dyspnea, palpitations, lower extremity edema, orthopnea, PND, dizziness, near syncope or syncope. He is here to discuss his abnormal stress test.     Primary Care Physician: [name], MD   Past Medical History:  Diagnosis Date  . Asthma   . CAD in native artery    a. NSTEMI 02/2016 with occ mLAD & occ small PDA both with collaterals, treated medically  . COPD (chronic obstructive pulmonary disease) ([place])   . Hyperlipidemia   . Ischemic cardiomyopathy    a. 02/2016: EF 45-50% by cath, 40-45% by echo.  . Pancreatic lesion     Past Surgical History:  Procedure Laterality Date  . CARDIAC CATHETERIZATION N/A 03/07/2016   Procedure: Left Heart Cath and Coronary Angiography;  Surgeon: [name], MD;  Location: [facility];  Service: Cardiovascular;  Laterality: N/A;    Current Outpatient Prescriptions  Medication Sig Dispense Refill  . albuterol (PROVENTIL) (2.5 MG/3ML) 0.083% nebulizer solution Take 2.5 mg by nebulization every 6 (six) hours as needed for wheezing or shortness of breath.    Marland Kitchen atorvastatin (LIPITOR) 80 MG tablet Take 1 tablet (80 mg total) by mouth daily at 6 PM. 30 tablet 11  . clopidogrel (PLAVIX) 75 MG tablet Take 1 tablet (75 mg total) by mouth daily. 30 tablet 11  . Fluticasone-Salmeterol (ADVAIR) 250-50 MCG/DOSE AEPB Inhale 1 puff into the lungs 2 (two) times daily as needed (shortness of breath). Reported on 04/19/2016    . metoprolol succinate (TOPROL-XL) 25 MG 24 hr tablet Take 12.5 mg by mouth daily.    . Testosterone (FORTESTA) 10 MG/ACT (2%) GEL Place 40 mg onto the skin every evening. 4 pumps    . losartan (COZAAR) 50 MG tablet Take 1 tablet (50 mg total) by mouth daily. 30 tablet 11   No current facility-administered medications for this visit.     Allergies  Allergen Reactions  . Nsaids Shortness Of Breath  . Tolmetin Shortness Of Breath  . Isosorbide Dinitrate     Other reaction(s): Other (See Comments) Severe migraine headache  . Isosorbide Mononitrate [Isosorbide Dinitrate Er] Other (See Comments)    Severe migraine headache  . Nitroglycerin Other (See Comments)    Pt reported BP dropped very low when he took  oral NTG  . Lisinopril Cough    Social History   Social History  . Marital status: Married    Spouse name: N/A  . Number of children: N/A  . Years of education: N/A   Occupational History  . Retired Nature conservation officer and [employer]    Social History Main Topics  . Smoking status: Never Smoker  . Smokeless tobacco: Never Used  . Alcohol use No  . Drug use: No  . Sexual activity: Not on file   Other Topics Concern  . Not on file   Social History Narrative   Lives in [place] with wife    Family History  Problem Relation Age of Onset  . Cancer  Father     Review of Systems:  As stated in the HPI and otherwise negative.   BP 110/60   Pulse 65   Ht 5\' 9"  (1.753 m)   Wt 196 lb 12.8 oz (89.3 kg)   SpO2 97%   BMI 29.06 kg/m   Physical Examination: General: Well developed, well nourished, NAD  HEENT: OP clear, mucus membranes moist  SKIN: warm, dry. No rashes. Neuro: No focal deficits  Musculoskeletal: Muscle strength 5/5 all ext  Psychiatric: Mood and affect normal  Neck: No JVD, no carotid bruits, no thyromegaly, no lymphadenopathy.  Lungs:Clear bilaterally, no wheezes, rhonci, crackles Cardiovascular: Regular rate and rhythm. No murmurs, gallops or rubs. Abdomen:Soft. Bowel sounds present. Non-tender.  Extremities: No lower extremity edema. Pulses are 2 + in the bilateral DP/PT.  Cardiac cath 03/07/16:  Ost RPDA lesion, 100% stenosed.  Mid LAD lesion, 100% stenosed.  There is mild to moderate left ventricular systolic dysfunction.   1. 2 vessel occlusive CAD.    -100% mid LAD after the second diagonal.     -100% flush occlusion of the PDA 2. Mild to moderate LV dysfunction  Echo March 2018: - Left ventricle: The cavity size was normal. Wall thickness was   increased in a pattern of moderate LVH. Systolic function was   normal. The estimated ejection fraction was in the range of 50%   to 55%. Mild anteroseptal hypokinesis. Doppler parameters are   consistent with abnormal left ventricular relaxation (grade 1   diastolic dysfunction). The E/e&' ratio is between 8-15,   suggesting indeterminate LV filling pressure. - Mitral valve: Calcified annulus. Mildly thickened leaflets .   There was trivial regurgitation. - Left atrium: The atrium was normal in size. - Right atrium: The atrium was mildly dilated. - Inferior vena cava: The vessel was normal in size. The   respirophasic diameter changes were in the normal range (>= 50%),   consistent with normal central venous pressure.  Impressions:  - Compared to a  prior study in 02/2016, the LVEF has improved to   50-55% (although there is still mild anteroseptal hypokinesis).  Nuclear stress test 05/24/17:  Nuclear stress EF: 47%.  There was no ST segment deviation noted during stress.  This is a high risk study.  The left ventricular ejection fraction is mildly decreased (45-54%).   1. EF 47% with apical hypokinesis.  2. Primarily reversible medium-sized, severe mid anteroseptal/mid inferoseptal/mid inferior, apical septal/apical inferior, and true apex perfusion defect.  This is concerning for ischemia.    EKG:  EKG is  Not ordered today. The ekg ordered today demonstrates   Recent Labs: 05/24/2017: ALT 16   Lipid Panel    Component Value Date/Time   CHOL 123 05/24/2017 0737   TRIG 68 05/24/2017 0737  HDL 36 (L) 05/24/2017 0737   CHOLHDL 3.4 05/24/2017 0737   CHOLHDL 5.1 (H) 04/29/2016 1004   VLDL 28 04/29/2016 1004   LDLCALC 73 05/24/2017 0737     Wt Readings from Last 3 Encounters:  06/01/17 196 lb 12.8 oz (89.3 kg)  05/24/17 197 lb (89.4 kg)  05/19/17 197 lb 1.9 oz (89.4 kg)     Other studies Reviewed: Additional studies/ records that were reviewed today include: . Review of the above records demonstrates:   Assessment and Plan:   1. CAD without angina: He is having no chest pain but his nuclear stress test shows possible anterior wall ischemia, likely due to his known chronic LAD occlusion. He is asymptomatic but is planning a complex GI/pancreatic surgery at [facility]. He will need a cardiac cath. Will plan at [facility] on 06/09/17 at [facility]. Risks and benefits of procedure reviewed. Pre-cath labs today. Will continue Plavix, statin, beta blocker. No ASA due to intolerance in past.     2. Ischemic cardiomyopathy: Echo March 2018 with LVEF of 50-55%. Will continue ARB and beta blocker.    3. Hyperlipidemia: Lipids controlled. Continue statin.    Current medicines are reviewed at length with the patient today.  The patient does not  have concerns regarding medicines.  The following changes have been made:  no change  Labs/ tests ordered today include:   Orders Placed This Encounter  Procedures  . CBC w/Diff  . Basic Metabolic Panel (BMET)  . INR/PT    Disposition:   FU with me in 6 months  Signed, [name], MD 06/01/2017 12:50 PM    [facility] [place], [place], [place]  [address] Phone: [phone]; Fax: [phone]

## 2017-06-02 LAB — CBC WITH DIFFERENTIAL/PLATELET
BASOS ABS: 0 10*3/uL (ref 0.0–0.2)
Basos: 1 %
EOS (ABSOLUTE): 0.2 10*3/uL (ref 0.0–0.4)
Eos: 4 %
HEMOGLOBIN: 13.6 g/dL (ref 13.0–17.7)
Hematocrit: 36.5 % — ABNORMAL LOW (ref 37.5–51.0)
Immature Grans (Abs): 0 10*3/uL (ref 0.0–0.1)
Immature Granulocytes: 0 %
LYMPHS ABS: 1.3 10*3/uL (ref 0.7–3.1)
Lymphs: 20 %
MCH: 30.2 pg (ref 26.6–33.0)
MCHC: 37.3 g/dL — ABNORMAL HIGH (ref 31.5–35.7)
MCV: 81 fL (ref 79–97)
MONOS ABS: 0.8 10*3/uL (ref 0.1–0.9)
Monocytes: 13 %
NEUTROS ABS: 4 10*3/uL (ref 1.4–7.0)
Neutrophils: 62 %
PLATELETS: 211 10*3/uL (ref 150–379)
RBC: 4.5 x10E6/uL (ref 4.14–5.80)
RDW: 13.9 % (ref 12.3–15.4)
WBC: 6.3 10*3/uL (ref 3.4–10.8)

## 2017-06-02 LAB — BASIC METABOLIC PANEL
BUN/Creatinine Ratio: 17 (ref 10–24)
BUN: 15 mg/dL (ref 8–27)
CALCIUM: 8.8 mg/dL (ref 8.6–10.2)
CHLORIDE: 102 mmol/L (ref 96–106)
CO2: 25 mmol/L (ref 20–29)
CREATININE: 0.89 mg/dL (ref 0.76–1.27)
GFR calc non Af Amer: 84 mL/min/{1.73_m2} (ref >59)
GFR, EST AFRICAN AMERICAN: 97 mL/min/{1.73_m2} (ref >59)
GLUCOSE: 96 mg/dL (ref 65–99)
Potassium: 4.5 mmol/L (ref 3.5–5.2)
Sodium: 139 mmol/L (ref 134–144)

## 2017-06-02 LAB — PROTIME-INR
INR: 1.1 (ref 0.8–1.2)
Prothrombin Time: 11 s (ref 9.1–12.0)

## 2017-06-07 ENCOUNTER — Telehealth: Payer: Self-pay

## 2017-06-07 NOTE — Telephone Encounter (Signed)
Patient contacted pre-catheterization at [facility] scheduled for:  06/09/2017 @ 0730 Verified arrival time and place:  [facility] @ 0530 Confirmed AM meds to be taken pre-cath with sip of water: Take ASA 81 mg and Plavix prior to arrival Confirmed patient has responsible person to drive home post procedure and observe patient for 24 hours:  wife Addl concerns:  Pt with intolerance to ASA, allergy to NSAID's, nitro

## 2017-06-09 ENCOUNTER — Encounter (HOSPITAL_COMMUNITY): Payer: Self-pay | Admitting: Cardiovascular Disease

## 2017-06-09 ENCOUNTER — Encounter (HOSPITAL_COMMUNITY)
Admission: RE | Disposition: A | Discharge status: Home / Self Care | Payer: Self-pay | Source: Ambulatory Visit | Attending: Cardiovascular Disease

## 2017-06-09 ENCOUNTER — Ambulatory Visit (HOSPITAL_COMMUNITY)
Admission: RE | Admit: 2017-06-09 | Discharge: 2017-06-09 | Disposition: A | Discharge status: Home / Self Care | Payer: Medicare Other | Source: Ambulatory Visit | Attending: Cardiovascular Disease | Admitting: Cardiovascular Disease

## 2017-06-09 ENCOUNTER — Encounter: Payer: Self-pay | Admitting: Cardiovascular Disease

## 2017-06-09 DIAGNOSIS — E785 Hyperlipidemia, unspecified: Secondary | ICD-10-CM | POA: Insufficient documentation

## 2017-06-09 DIAGNOSIS — I2582 Chronic total occlusion of coronary artery: Secondary | ICD-10-CM | POA: Diagnosis not present

## 2017-06-09 DIAGNOSIS — J449 Chronic obstructive pulmonary disease, unspecified: Secondary | ICD-10-CM | POA: Insufficient documentation

## 2017-06-09 DIAGNOSIS — I2510 Atherosclerotic heart disease of native coronary artery without angina pectoris: Secondary | ICD-10-CM

## 2017-06-09 DIAGNOSIS — R9439 Abnormal result of other cardiovascular function study: Secondary | ICD-10-CM

## 2017-06-09 DIAGNOSIS — I252 Old myocardial infarction: Secondary | ICD-10-CM | POA: Insufficient documentation

## 2017-06-09 DIAGNOSIS — Z7902 Long term (current) use of antithrombotics/antiplatelets: Secondary | ICD-10-CM | POA: Insufficient documentation

## 2017-06-09 DIAGNOSIS — Z888 Allergy status to other drugs, medicaments and biological substances status: Secondary | ICD-10-CM | POA: Diagnosis not present

## 2017-06-09 DIAGNOSIS — Z79899 Other long term (current) drug therapy: Secondary | ICD-10-CM | POA: Diagnosis not present

## 2017-06-09 DIAGNOSIS — I255 Ischemic cardiomyopathy: Secondary | ICD-10-CM | POA: Insufficient documentation

## 2017-06-09 HISTORY — PX: LEFT HEART CATH AND CORONARY ANGIOGRAPHY: CATH118249

## 2017-06-09 SURGERY — LEFT HEART CATH AND CORONARY ANGIOGRAPHY
Anesthesia: LOCAL

## 2017-06-09 MED ORDER — LIDOCAINE HCL (PF) 1 % IJ SOLN
INTRAMUSCULAR | Status: AC
  Filled 2017-06-09: qty 30

## 2017-06-09 MED ORDER — SODIUM CHLORIDE 0.9 % IV SOLN: 250.0000 mL | INTRAVENOUS | Status: DC | PRN

## 2017-06-09 MED ORDER — IOPAMIDOL (ISOVUE-370) INJECTION 76%
INTRAVENOUS | Status: DC | PRN
  Administered 2017-06-09: 75 mL

## 2017-06-09 MED ORDER — VERAPAMIL HCL 2.5 MG/ML IV SOLN
INTRAVENOUS | Status: AC
  Filled 2017-06-09: qty 2

## 2017-06-09 MED ORDER — SODIUM CHLORIDE 0.9% FLUSH: 3.0000 mL | INTRAVENOUS | Status: DC | PRN

## 2017-06-09 MED ORDER — HEPARIN SODIUM (PORCINE) 1000 UNIT/ML IJ SOLN
INTRAMUSCULAR | Status: AC
  Filled 2017-06-09: qty 1

## 2017-06-09 MED ORDER — SODIUM CHLORIDE 0.9% FLUSH: 3.0000 mL | Freq: Two times a day (BID) | INTRAVENOUS | Status: DC

## 2017-06-09 MED ORDER — HEPARIN SODIUM (PORCINE) 1000 UNIT/ML IJ SOLN
INTRAMUSCULAR | Status: DC | PRN
  Administered 2017-06-09: 4500 [IU] via INTRAVENOUS

## 2017-06-09 MED ORDER — SODIUM CHLORIDE 0.9 % IV SOLN
INTRAVENOUS | Status: AC
Start: 2017-06-09 — End: 2017-06-09
  Administered 2017-06-09: 06:00:00 via INTRAVENOUS

## 2017-06-09 MED ORDER — HEPARIN (PORCINE) IN NACL 2-0.9 UNIT/ML-% IJ SOLN
INTRAMUSCULAR | Status: AC
  Filled 2017-06-09: qty 1000

## 2017-06-09 MED ORDER — ASPIRIN 81 MG PO CHEW: 81.0000 mg | CHEWABLE_TABLET | ORAL | Status: DC

## 2017-06-09 MED ORDER — MIDAZOLAM HCL 2 MG/2ML IJ SOLN
INTRAMUSCULAR | Status: AC
  Filled 2017-06-09: qty 2

## 2017-06-09 MED ORDER — LIDOCAINE HCL (PF) 1 % IJ SOLN
INTRAMUSCULAR | Status: DC | PRN
  Administered 2017-06-09: 2 mL

## 2017-06-09 MED ORDER — FENTANYL CITRATE (PF) 100 MCG/2ML IJ SOLN
INTRAMUSCULAR | Status: AC
  Filled 2017-06-09: qty 2

## 2017-06-09 MED ORDER — FENTANYL CITRATE (PF) 100 MCG/2ML IJ SOLN
INTRAMUSCULAR | Status: DC | PRN
  Administered 2017-06-09: 25 ug via INTRAVENOUS

## 2017-06-09 MED ORDER — HEPARIN (PORCINE) IN NACL 2-0.9 UNIT/ML-% IJ SOLN
INTRAMUSCULAR | Status: AC | PRN
  Administered 2017-06-09: 1000 mL

## 2017-06-09 MED ORDER — MIDAZOLAM HCL 2 MG/2ML IJ SOLN
INTRAMUSCULAR | Status: DC | PRN
  Administered 2017-06-09: 1 mg via INTRAVENOUS

## 2017-06-09 MED ORDER — IOPAMIDOL (ISOVUE-370) INJECTION 76%
INTRAVENOUS | Status: AC
  Filled 2017-06-09: qty 100

## 2017-06-09 MED ORDER — VERAPAMIL HCL 2.5 MG/ML IV SOLN
INTRAVENOUS | Status: DC | PRN
  Administered 2017-06-09: 10 mL via INTRA_ARTERIAL

## 2017-06-09 MED ORDER — SODIUM CHLORIDE 0.9 % IV SOLN: INTRAVENOUS | Status: AC

## 2017-06-09 SURGICAL SUPPLY — 12 items
CATH 5FR JL3.5 JR4 ANG PIG MP (CATHETERS) ×1 IMPLANT
COVER PRB 48X5XTLSCP FOLD TPE (BAG) IMPLANT
COVER PROBE 5X48 (BAG) ×2
DEVICE RAD COMP TR BAND LRG (VASCULAR PRODUCTS) ×1 IMPLANT
GLIDESHEATH SLEND SS 6F .021 (SHEATH) ×1 IMPLANT
GUIDEWIRE INQWIRE 1.5J.035X260 (WIRE) IMPLANT
INQWIRE 1.5J .035X260CM (WIRE) ×2
KIT HEART LEFT (KITS) ×2 IMPLANT
PACK CARDIAC CATHETERIZATION (CUSTOM PROCEDURE TRAY) ×2 IMPLANT
SYR MEDRAD MARK V 150ML (SYRINGE) ×2 IMPLANT
TRANSDUCER W/STOPCOCK (MISCELLANEOUS) ×2 IMPLANT
TUBING CIL FLEX 10 FLL-RA (TUBING) ×2 IMPLANT

## 2017-06-09 NOTE — Interval H&P Note (Signed)
History and Physical Interval Note:  06/09/2017 7:18 AM  Frank Ayala  has presented today for cardiac cath with the diagnosis of CAD, abnormal stress test  The various methods of treatment have been discussed with the patient and family. After consideration of risks, benefits and other options for treatment, the patient has consented to  Procedure(s): LEFT HEART CATH AND CORONARY ANGIOGRAPHY (N/A) as a surgical intervention .  The patient's history has been reviewed, patient examined, no change in status, stable for surgery.  I have reviewed the patient's chart and labs.  Questions were answered to the patient's satisfaction.    Cath Lab Visit (complete for each Cath Lab visit)  Clinical Evaluation Leading to the Procedure:   ACS: No.  Non-ACS:    Anginal Classification: No Symptoms  Anti-ischemic medical therapy: Minimal Therapy (1 class of medications)  Non-Invasive Test Results: High-risk stress test findings: cardiac mortality >3%/year  Prior CABG: No previous CABG        Frank Ayala

## 2017-06-09 NOTE — H&P (View-Only) (Signed)
Chief Complaint  Patient presents with  . Follow-up    CAD     History of Present Illness: 76 yo male with history of COPD, asthma, hyperlipidemia, CAD, ischemic cardiomyopathy who is here today for follow up. He was admitted to [facility] 03/07/16 with a NSTEMI and underwent cardiac cath on 03/07/16 per Dr. Martinique. This showed 100% occlusion of the mid LAD and 100% occlusion of the small PDA, both vessels filling from collaterals. His LAD occlusion was felt to be more than 12 hours from presentation so the plan was for medical management. LVEF=40-45% by echo. He was not started on ASA due to intolerance.  Repeat echo March 2018 with LVEF=50-55%. 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 He has been very active and has been doing yard work and roof work. He has had no chest pain but a stress test was recommended prior to his planned surgical procedure given his extensive CAD. As expected, his nuclear stress test was abnormal. His nuclear stress test showed a reversible defect in the mid anteroseptal/mid inferoseptal and true apical defect, possibly due to ischemia.   He is here today for follow up. The patient denies any chest pain, dyspnea, palpitations, lower extremity edema, orthopnea, PND, dizziness, near syncope or syncope. He is here to discuss his abnormal stress test.     Primary Care Physician: [name], MD   Past Medical History:  Diagnosis Date  . Asthma   . CAD in native artery    a. NSTEMI 02/2016 with occ mLAD & occ small PDA both with collaterals, treated medically  . COPD (chronic obstructive pulmonary disease) ([place])   . Hyperlipidemia   . Ischemic cardiomyopathy    a. 02/2016: EF 45-50% by cath, 40-45% by echo.  . Pancreatic lesion     Past Surgical History:  Procedure Laterality Date  . CARDIAC CATHETERIZATION N/A 03/07/2016   Procedure: Left Heart Cath and Coronary Angiography;  Surgeon: [name], MD;  Location: [facility] [facility];  Service: Cardiovascular;  Laterality: N/A;    Current Outpatient Prescriptions  Medication Sig Dispense Refill  . albuterol (PROVENTIL) (2.5 MG/3ML) 0.083% nebulizer solution Take 2.5 mg by nebulization every 6 (six) hours as needed for wheezing or shortness of breath.    Marland Kitchen atorvastatin (LIPITOR) 80 MG tablet Take 1 tablet (80 mg total) by mouth daily at 6 PM. 30 tablet 11  . clopidogrel (PLAVIX) 75 MG tablet Take 1 tablet (75 mg total) by mouth daily. 30 tablet 11  . Fluticasone-Salmeterol (ADVAIR) 250-50 MCG/DOSE AEPB Inhale 1 puff into the lungs 2 (two) times daily as needed (shortness of breath). Reported on 04/19/2016    . metoprolol succinate (TOPROL-XL) 25 MG 24 hr tablet Take 12.5 mg by mouth daily.    . Testosterone (FORTESTA) 10 MG/ACT (2%) GEL Place 40 mg onto the skin every evening. 4 pumps    . losartan (COZAAR) 50 MG tablet Take 1 tablet (50 mg total) by mouth daily. 30 tablet 11   No current facility-administered medications for this visit.     Allergies  Allergen Reactions  . Nsaids Shortness Of Breath  . Tolmetin Shortness Of Breath  . Isosorbide Dinitrate     Other reaction(s): Other (See Comments) Severe migraine headache  . Isosorbide Mononitrate [Isosorbide Dinitrate Er] Other (See Comments)    Severe migraine headache  . Nitroglycerin Other (See Comments)    Pt reported BP dropped very low when he took  oral NTG  . Lisinopril Cough    Social History   Social History  . Marital status: Married    Spouse name: N/A  . Number of children: N/A  . Years of education: N/A   Occupational History  . Retired Nature conservation officer and [employer]    Social History Main Topics  . Smoking status: Never Smoker  . Smokeless tobacco: Never Used  . Alcohol use No  . Drug use: No  . Sexual activity: Not on file   Other Topics Concern  . Not on file   Social History Narrative   Lives in [place] with wife    Family History  Problem Relation Age of Onset  . Cancer  Father     Review of Systems:  As stated in the HPI and otherwise negative.   BP 110/60   Pulse 65   Ht 5\' 9"  (1.753 m)   Wt 196 lb 12.8 oz (89.3 kg)   SpO2 97%   BMI 29.06 kg/m   Physical Examination: General: Well developed, well nourished, NAD  HEENT: OP clear, mucus membranes moist  SKIN: warm, dry. No rashes. Neuro: No focal deficits  Musculoskeletal: Muscle strength 5/5 all ext  Psychiatric: Mood and affect normal  Neck: No JVD, no carotid bruits, no thyromegaly, no lymphadenopathy.  Lungs:Clear bilaterally, no wheezes, rhonci, crackles Cardiovascular: Regular rate and rhythm. No murmurs, gallops or rubs. Abdomen:Soft. Bowel sounds present. Non-tender.  Extremities: No lower extremity edema. Pulses are 2 + in the bilateral DP/PT.  Cardiac cath 03/07/16:  Ost RPDA lesion, 100% stenosed.  Mid LAD lesion, 100% stenosed.  There is mild to moderate left ventricular systolic dysfunction.   1. 2 vessel occlusive CAD.    -100% mid LAD after the second diagonal.     -100% flush occlusion of the PDA 2. Mild to moderate LV dysfunction  Echo March 2018: - Left ventricle: The cavity size was normal. Wall thickness was   increased in a pattern of moderate LVH. Systolic function was   normal. The estimated ejection fraction was in the range of 50%   to 55%. Mild anteroseptal hypokinesis. Doppler parameters are   consistent with abnormal left ventricular relaxation (grade 1   diastolic dysfunction). The E/e&' ratio is between 8-15,   suggesting indeterminate LV filling pressure. - Mitral valve: Calcified annulus. Mildly thickened leaflets .   There was trivial regurgitation. - Left atrium: The atrium was normal in size. - Right atrium: The atrium was mildly dilated. - Inferior vena cava: The vessel was normal in size. The   respirophasic diameter changes were in the normal range (>= 50%),   consistent with normal central venous pressure.  Impressions:  - Compared to a  prior study in 02/2016, the LVEF has improved to   50-55% (although there is still mild anteroseptal hypokinesis).  Nuclear stress test 05/24/17:  Nuclear stress EF: 47%.  There was no ST segment deviation noted during stress.  This is a high risk study.  The left ventricular ejection fraction is mildly decreased (45-54%).   1. EF 47% with apical hypokinesis.  2. Primarily reversible medium-sized, severe mid anteroseptal/mid inferoseptal/mid inferior, apical septal/apical inferior, and true apex perfusion defect.  This is concerning for ischemia.    EKG:  EKG is  Not ordered today. The ekg ordered today demonstrates   Recent Labs: 05/24/2017: ALT 16   Lipid Panel    Component Value Date/Time   CHOL 123 05/24/2017 0737   TRIG 68 05/24/2017 0737  HDL 36 (L) 05/24/2017 0737   CHOLHDL 3.4 05/24/2017 0737   CHOLHDL 5.1 (H) 04/29/2016 1004   VLDL 28 04/29/2016 1004   LDLCALC 73 05/24/2017 0737     Wt Readings from Last 3 Encounters:  06/01/17 196 lb 12.8 oz (89.3 kg)  05/24/17 197 lb (89.4 kg)  05/19/17 197 lb 1.9 oz (89.4 kg)     Other studies Reviewed: Additional studies/ records that were reviewed today include: . Review of the above records demonstrates:   Assessment and Plan:   1. CAD without angina: He is having no chest pain but his nuclear stress test shows possible anterior wall ischemia, likely due to his known chronic LAD occlusion. He is asymptomatic but is planning a complex GI/pancreatic surgery at [facility]. He will need a cardiac cath. Will plan at [facility] on [facility]/09[facility]17 at [facility]. Risks and benefits of procedure reviewed. Pre-cath labs today. Will continue Plavix, statin, beta blocker. No ASA due to intolerance in past.     2. Ischemic cardiomyopathy: Echo March 2018 with LVEF of 50-55%. Will continue ARB and beta blocker.    3. Hyperlipidemia: Lipids controlled. Continue statin.    Current medicines are reviewed at length with the patient today.  The patient does not  have concerns regarding medicines.  The following changes have been made:  no change  Labs/ tests ordered today include:   Orders Placed This Encounter  Procedures  . CBC w/Diff  . Basic Metabolic Panel (BMET)  . INR/PT    Disposition:   FU with me in 6 months  Signed, [name], MD 06/01/2017 12:50 PM    [facility] [place], [place], [place]  [address] Phone: [phone]; Fax: [phone]

## 2017-06-09 NOTE — Discharge Instructions (Signed)

## 2017-06-28 ENCOUNTER — Ambulatory Visit: Payer: Medicare Other | Admitting: Cardiovascular Disease

## 2017-07-10 ENCOUNTER — Ambulatory Visit: Payer: Medicare Other | Admitting: Physician Assistant

## 2017-07-11 ENCOUNTER — Other Ambulatory Visit: Payer: Self-pay | Admitting: Cardiovascular Disease

## 2017-10-16 ENCOUNTER — Other Ambulatory Visit: Payer: Self-pay | Admitting: Otolaryngology

## 2017-10-16 DIAGNOSIS — J324 Chronic pansinusitis: Secondary | ICD-10-CM

## 2017-10-16 DIAGNOSIS — J339 Nasal polyp, unspecified: Secondary | ICD-10-CM

## 2017-10-31 ENCOUNTER — Ambulatory Visit
Admission: RE | Admit: 2017-10-31 | Discharge: 2017-10-31 | Disposition: A | Discharge status: Home / Self Care | Payer: Medicare Other | Source: Ambulatory Visit | Attending: Otolaryngology | Admitting: Otolaryngology

## 2017-10-31 DIAGNOSIS — J324 Chronic pansinusitis: Secondary | ICD-10-CM

## 2017-10-31 DIAGNOSIS — J339 Nasal polyp, unspecified: Secondary | ICD-10-CM

## 2017-11-06 ENCOUNTER — Telehealth: Payer: Self-pay | Admitting: Cardiovascular Disease

## 2017-11-06 NOTE — Telephone Encounter (Signed)
° °  [facility] Pre-operative Risk Assessment    Request for surgical clearance:  1. What type of surgery is being performed? Endoscopic Sinus Surgery   2. When is this surgery scheduled? Pending   3. Are there any medications that need to be held prior to surgery and how long?General Cardiac Clearance-Can pt stop his Plavix 5 to 7 days prior to surgery?   4. Practice name and name of physician performing surgery? Dr [name]   5. What is your office phone and fax number? [phone] and fax is [phone]   6. Anesthesia type (None, local, MAC, general) ?General   Glyn Ade 11/06/2017, 11:21 AM  _________________________________________________________________   (provider comments below)

## 2017-11-07 ENCOUNTER — Encounter: Payer: Self-pay | Admitting: Adult Health

## 2017-11-07 NOTE — Progress Notes (Unsigned)
   Primary Cardiologist: No primary care provider on file.  Chart reviewed as part of pre-operative protocol coverage. Given past medical history and time since last visit, based on ACC/AHA guidelines, Frank Ayala would be at acceptable risk for the planned procedure without further cardiovascular testing.   I have spoken to the patient by phone to ascertain if he is having any further symptoms.  Patient is doing well and has no new symptoms.  The patient will need to hold his Plavix for 7 days prior to surgical procedure to prevent excessive bleeding.  The patient will need to start back on Plavix ASAP post procedure.  I will route this recommendation to the requesting party via Epic fax function and remove from pre-op pool.  Please call with questions.  Jory Sims DNP   11/07/2017, 2:02 PM

## 2017-11-08 NOTE — Telephone Encounter (Signed)
Per yesterday's conversation patient is cleared as below. I will resend fax and close the encounter.      Primary Cardiologist: No primary care provider on file.  Chart reviewed as part of pre-operative protocol coverage. Given past medical history and time since last visit, based on ACC/AHA guidelines, Frank Ayala would be at acceptable risk for the planned procedure without further cardiovascular testing.   I have spoken to the patient by phone to ascertain if he is having any further symptoms.  Patient is doing well and has no new symptoms.  The patient will need to hold his Plavix for 7 days prior to surgical procedure to prevent excessive bleeding.  The patient will need to start back on Plavix ASAP post procedure.  I will route this recommendation to the requesting party via Epic fax function and remove from pre-op pool.  Please call with questions.  Frank Sims DNP   11/07/2017, 2:02 PM

## 2017-11-17 ENCOUNTER — Other Ambulatory Visit: Payer: Self-pay | Admitting: Otolaryngology

## 2017-11-23 NOTE — Pre-Procedure Instructions (Signed)
Frank Ayala  11/23/2017      [facility] - [place], [place] - [address] [place] [address] Phone: [phone] Fax: [phone]  [facility] - [address], [place] [address] Phone: [phone] Fax: [phone]    Your procedure is scheduled on Wednesday February 6.  Report to [facility] Admitting at 8:50 A.M.  Call this number if you have problems the morning of surgery:  [phone]   Remember:  Do not eat food or drink liquids after midnight.  Take these medicines the morning of surgery with A SIP OF WATER:   Metoprolol (toprol-XL) Acetaminophen (tylenol) if needed Albuterol if needed (please bring inhaler to hospital with you) Advair  7 days prior to surgery STOP taking any Aspirin(unless otherwise instructed by your surgeon), Aleve, Naproxen, Ibuprofen, Motrin, Advil, Goody's, BC's, all herbal medications, fish oil, and all vitamins  **Follow your surgeon's instructions on stopping Plavix (clopidogrel). If no instructions were given, please call surgeon's office**   Do not wear jewelry, make-up or nail polish.  Do not wear lotions, powders, or perfumes, or deodorant.  Do not shave 48 hours prior to surgery.  Men may shave face and neck.  Do not bring valuables to the hospital.  [facility] is not responsible for any belongings or valuables.  Contacts, dentures or bridgework may not be worn into surgery.  Leave your suitcase in the car.  After surgery it may be brought to your room.  For patients admitted to the hospital, discharge time will be determined by your treatment team.  Patients discharged the day of surgery will not be allowed to drive home.   Special instructions:    [facility]- Preparing For Surgery  Before surgery, you can play an important role. Because skin is not sterile,  your skin needs to be as free of germs as possible. You can reduce the number of germs on your skin by washing with CHG (chlorahexidine gluconate) Soap before surgery.  CHG is an antiseptic cleaner which kills germs and bonds with the skin to continue killing germs even after washing.  Please do not use if you have an allergy to CHG or antibacterial soaps. If your skin becomes reddened/irritated stop using the CHG.  Do not shave (including legs and underarms) for at least 48 hours prior to first CHG shower. It is OK to shave your face.  Please follow these instructions carefully.   1. Shower the NIGHT BEFORE SURGERY and the MORNING OF SURGERY with CHG.   2. If you chose to wash your hair, wash your hair first as usual with your normal shampoo.  3. After you shampoo, rinse your hair and body thoroughly to remove the shampoo.  4. Use CHG as you would any other liquid soap. You can apply CHG directly to the skin and wash gently with a scrungie or a clean washcloth.   5. Apply the CHG Soap to your body ONLY FROM THE NECK DOWN.  Do not use on open wounds or open sores. Avoid contact with your eyes, ears, mouth and genitals (private parts). Wash Face and genitals (private parts)  with your normal soap.  6. Wash thoroughly, paying special attention to the area where your surgery will be performed.  7. Thoroughly rinse your body with warm water from the neck down.  8. DO NOT shower/wash with your normal soap after using and rinsing off the CHG Soap.  9. Pat yourself dry with a CLEAN TOWEL.  10. Wear CLEAN PAJAMAS to bed the night before surgery, wear comfortable clothes the morning of surgery  11. Place CLEAN SHEETS on your bed the night of your first shower and DO NOT SLEEP WITH PETS.    Day of Surgery: Do not apply any deodorants/lotions. Please wear clean clothes to the hospital/surgery center.      Please read over the following fact sheets that you were given. Coughing and Deep  Breathing and Surgical Site Infection Prevention

## 2017-11-24 ENCOUNTER — Other Ambulatory Visit: Payer: Self-pay

## 2017-11-24 ENCOUNTER — Encounter (HOSPITAL_COMMUNITY): Payer: Self-pay

## 2017-11-24 ENCOUNTER — Encounter (HOSPITAL_COMMUNITY)
Admission: RE | Admit: 2017-11-24 | Discharge: 2017-11-24 | Disposition: A | Discharge status: Home / Self Care | Payer: Medicare Other | Source: Ambulatory Visit | Attending: Otolaryngology | Admitting: Otolaryngology

## 2017-11-24 DIAGNOSIS — Z7902 Long term (current) use of antithrombotics/antiplatelets: Secondary | ICD-10-CM | POA: Insufficient documentation

## 2017-11-24 DIAGNOSIS — Z01812 Encounter for preprocedural laboratory examination: Secondary | ICD-10-CM | POA: Insufficient documentation

## 2017-11-24 DIAGNOSIS — Z01818 Encounter for other preprocedural examination: Secondary | ICD-10-CM | POA: Insufficient documentation

## 2017-11-24 DIAGNOSIS — E785 Hyperlipidemia, unspecified: Secondary | ICD-10-CM | POA: Diagnosis not present

## 2017-11-24 DIAGNOSIS — Z79899 Other long term (current) drug therapy: Secondary | ICD-10-CM | POA: Diagnosis not present

## 2017-11-24 DIAGNOSIS — J449 Chronic obstructive pulmonary disease, unspecified: Secondary | ICD-10-CM | POA: Insufficient documentation

## 2017-11-24 DIAGNOSIS — Z7951 Long term (current) use of inhaled steroids: Secondary | ICD-10-CM | POA: Insufficient documentation

## 2017-11-24 DIAGNOSIS — I255 Ischemic cardiomyopathy: Secondary | ICD-10-CM | POA: Diagnosis not present

## 2017-11-24 DIAGNOSIS — I2510 Atherosclerotic heart disease of native coronary artery without angina pectoris: Secondary | ICD-10-CM | POA: Diagnosis not present

## 2017-11-24 HISTORY — DX: Malignant (primary) neoplasm, unspecified: C80.1

## 2017-11-24 LAB — BASIC METABOLIC PANEL
ANION GAP: 10 (ref 5–15)
BUN: 14 mg/dL (ref 6–20)
CHLORIDE: 102 mmol/L (ref 101–111)
CO2: 26 mmol/L (ref 22–32)
Calcium: 9.1 mg/dL (ref 8.9–10.3)
Creatinine, Ser: 1.01 mg/dL (ref 0.61–1.24)
GFR calc Af Amer: >60 mL/min (ref >60)
Glucose, Bld: 91 mg/dL (ref 65–99)
POTASSIUM: 5.2 mmol/L — AB (ref 3.5–5.1)
SODIUM: 138 mmol/L (ref 135–145)

## 2017-11-24 LAB — CBC
HCT: 40 % (ref 39.0–52.0)
HEMOGLOBIN: 14.5 g/dL (ref 13.0–17.0)
MCH: 30.8 pg (ref 26.0–34.0)
MCHC: 36.3 g/dL — ABNORMAL HIGH (ref 30.0–36.0)
MCV: 84.9 fL (ref 78.0–100.0)
PLATELETS: 474 10*3/uL — AB (ref 150–400)
RBC: 4.71 MIL/uL (ref 4.22–5.81)
RDW: 14 % (ref 11.5–15.5)
WBC: 7.1 10*3/uL (ref 4.0–10.5)

## 2017-11-24 NOTE — Progress Notes (Signed)
PCP - [place] at [facility] Cardiologist - Dr. Angelena Form. See note from Jory Sims in epic  EKG -05/19/17  Stress Test - 05/24/17 ECHO - 01/11/17 Cardiac Cath - 06/09/17   Blood Thinner Instructions: Last dose of Plavix 11/23/17  Anesthesia review: heart history  Patient denies shortness of breath, fever, cough and chest pain at PAT appointment   Patient verbalized understanding of instructions that were given to them at the PAT appointment. Patient was also instructed that they will need to review over the PAT instructions again at home before surgery.

## 2017-11-27 NOTE — Progress Notes (Signed)
Anesthesia Chart Review:  Pt is a 77 year old male scheduled for endoscopic sinus surgery with fusion on 11/29/2017 with [name], MD  - PCP is [name], MD - Cardiologist is [name], MD. Cleared for surgery by Jory Sims, NP on 11/07/17.  - Gastroenterologist is Rigoberto Noel, MD (notes in care everywhere)  - Surgical oncologist is Juliann Mule, MD (notes in care everywhere)   PMH includes: CAD (chronically occluded LAD with collaterals), ischemic cardiomyopathy, COPD, asthma, hyperlipidemia, pancreatic lesion (intraductal papillary mucinous neoplasm). Never smoker. BMI 30  Medications include: Albuterol, Lipitor, Plavix, Advair, Creon, losartan, metoprolol, potassium. Pt to hold plavix 7 days before surgery  BP 140/69   Pulse 77   Temp 36.5 C (Oral)   Resp 20   Ht 5\' 9"  (1.753 m)   Wt 205 lb 6.4 oz (93.2 kg)   SpO2 98%   BMI 30.33 kg/m   Preoperative labs reviewed.    CT abdomen 10/09/17 (care everywhere):  -Unchanged appearance of cystic lesion in the pancreatic head/body which may represent IPMN versus dilated side branches pancreatic duct. No new suspicious/aggressive features.  Cardiac cath 06/09/17 (done for high risk stress test):   Ost RPDA lesion, 100 %stenosed.  Mid LAD lesion, 100 %stenosed.  Ost 1st Sept to 1st Sept lesion, 70 %stenosed.  The left ventricular ejection fraction is 45-50% by visual estimate.  There is mild left ventricular systolic dysfunction.  LV end diastolic pressure is normal.  There is no mitral valve regurgitation. 1. Chronic occlusion of the mid LAD just after a large diagonal branch. The mid and distal LAD fills from right to left collaterals and from left to left collaterals.  2. 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 Mild LV systolic dysfunction with apical hypokinesis.  4. Normal LV filling pressures - Recommendations: His CAD is stable. No good options for treatment of chronic LAD occlusion which  explains the abnormal findings on his nuclear stress test. Continue medical management of CAD  Echo 01/11/17:  - Left ventricle: The cavity size was normal. Wall thickness was increased in a pattern of moderate LVH. Systolic function was normal. The estimated ejection fraction was in the range of 50% to 55%. Mild anteroseptal hypokinesis. Doppler parameters are consistent with abnormal left ventricular relaxation (grade 1 diastolic dysfunction). The E/e&' ratio is between 8-15, suggesting indeterminate LV filling pressure. - Mitral valve: Calcified annulus. Mildly thickened leaflets. There was trivial regurgitation. - Left atrium: The atrium was normal in size. - Right atrium: The atrium was mildly dilated. - Inferior vena cava: The vessel was normal in size. The respirophasic diameter changes were in the normal range (>= 50%), consistent with normal central venous pressure. - Impressions: Compared to a prior study in 02/2016, the LVEF has improved to 50-55% (although there is still mild anteroseptal hypokinesis).   If no changes, I anticipate pt can proceed with surgery as scheduled.   Willeen Cass, FNP-BC [facility]/Anesthesiology Phone: [phone] 11/27/2017 11:03 AM

## 2017-11-29 ENCOUNTER — Ambulatory Visit (HOSPITAL_COMMUNITY): Payer: Medicare Other | Admitting: Emergency Medicine

## 2017-11-29 ENCOUNTER — Encounter (HOSPITAL_COMMUNITY)
Admission: RE | Disposition: A | Discharge status: Home / Self Care | Payer: Self-pay | Source: Ambulatory Visit | Attending: Otolaryngology

## 2017-11-29 ENCOUNTER — Encounter (HOSPITAL_COMMUNITY): Payer: Self-pay

## 2017-11-29 ENCOUNTER — Ambulatory Visit (HOSPITAL_COMMUNITY): Payer: Medicare Other | Admitting: Anesthesiology

## 2017-11-29 ENCOUNTER — Ambulatory Visit (HOSPITAL_COMMUNITY)
Admission: RE | Admit: 2017-11-29 | Discharge: 2017-11-29 | Disposition: A | Discharge status: Home / Self Care | Payer: Medicare Other | Source: Ambulatory Visit | Attending: Otolaryngology | Admitting: Otolaryngology

## 2017-11-29 DIAGNOSIS — E785 Hyperlipidemia, unspecified: Secondary | ICD-10-CM | POA: Insufficient documentation

## 2017-11-29 DIAGNOSIS — I252 Old myocardial infarction: Secondary | ICD-10-CM | POA: Diagnosis not present

## 2017-11-29 DIAGNOSIS — J338 Other polyp of sinus: Secondary | ICD-10-CM | POA: Diagnosis not present

## 2017-11-29 DIAGNOSIS — J324 Chronic pansinusitis: Secondary | ICD-10-CM | POA: Insufficient documentation

## 2017-11-29 DIAGNOSIS — K869 Disease of pancreas, unspecified: Secondary | ICD-10-CM | POA: Insufficient documentation

## 2017-11-29 DIAGNOSIS — J342 Deviated nasal septum: Secondary | ICD-10-CM | POA: Insufficient documentation

## 2017-11-29 DIAGNOSIS — Z888 Allergy status to other drugs, medicaments and biological substances status: Secondary | ICD-10-CM | POA: Diagnosis not present

## 2017-11-29 DIAGNOSIS — Z79899 Other long term (current) drug therapy: Secondary | ICD-10-CM | POA: Diagnosis not present

## 2017-11-29 DIAGNOSIS — J45909 Unspecified asthma, uncomplicated: Secondary | ICD-10-CM | POA: Insufficient documentation

## 2017-11-29 DIAGNOSIS — I2510 Atherosclerotic heart disease of native coronary artery without angina pectoris: Secondary | ICD-10-CM | POA: Insufficient documentation

## 2017-11-29 DIAGNOSIS — Z7951 Long term (current) use of inhaled steroids: Secondary | ICD-10-CM | POA: Diagnosis not present

## 2017-11-29 DIAGNOSIS — Z886 Allergy status to analgesic agent status: Secondary | ICD-10-CM | POA: Insufficient documentation

## 2017-11-29 DIAGNOSIS — Z809 Family history of malignant neoplasm, unspecified: Secondary | ICD-10-CM | POA: Insufficient documentation

## 2017-11-29 DIAGNOSIS — J449 Chronic obstructive pulmonary disease, unspecified: Secondary | ICD-10-CM | POA: Insufficient documentation

## 2017-11-29 DIAGNOSIS — Z85828 Personal history of other malignant neoplasm of skin: Secondary | ICD-10-CM | POA: Diagnosis not present

## 2017-11-29 DIAGNOSIS — I255 Ischemic cardiomyopathy: Secondary | ICD-10-CM | POA: Diagnosis not present

## 2017-11-29 HISTORY — PX: SINUS ENDO WITH FUSION: SHX5329

## 2017-11-29 HISTORY — PX: SEPTOPLASTY: SHX2393

## 2017-11-29 LAB — GRAM STAIN

## 2017-11-29 SURGERY — SURGERY, PARANASAL SINUS, ENDOSCOPIC, WITH NASAL SEPTOPLASTY, TURBINOPLASTY, AND MAXILLARY SINUSOTOMY
Anesthesia: General | Site: Nose

## 2017-11-29 MED ORDER — MEPERIDINE HCL 50 MG/ML IJ SOLN: 6.2500 mg | INTRAMUSCULAR | Status: DC | PRN

## 2017-11-29 MED ORDER — PROPOFOL 10 MG/ML IV BOLUS
INTRAVENOUS | Status: AC
Start: 2017-11-29 — End: ?
  Filled 2017-11-29: qty 40

## 2017-11-29 MED ORDER — ONDANSETRON HCL 4 MG/2ML IJ SOLN
4.0000 mg | Freq: Once | INTRAMUSCULAR | Status: DC | PRN
Start: 2017-11-29 — End: 2017-11-29

## 2017-11-29 MED ORDER — LIDOCAINE-EPINEPHRINE 1 %-1:100000 IJ SOLN
INTRAMUSCULAR | Status: DC | PRN
  Administered 2017-11-29: 20 mL

## 2017-11-29 MED ORDER — FENTANYL CITRATE (PF) 100 MCG/2ML IJ SOLN
INTRAMUSCULAR | Status: DC | PRN
  Administered 2017-11-29: 100 ug via INTRAVENOUS
  Administered 2017-11-29 (×2): 50 ug via INTRAVENOUS

## 2017-11-29 MED ORDER — AMOXICILLIN-POT CLAVULANATE 875-125 MG PO TABS: 1.0000 | ORAL_TABLET | Freq: Two times a day (BID) | ORAL | 0 refills | Status: AC

## 2017-11-29 MED ORDER — SODIUM CHLORIDE 0.9 % IR SOLN
Status: DC | PRN
  Administered 2017-11-29: 1000 mL

## 2017-11-29 MED ORDER — MUPIROCIN 2 % EX OINT
TOPICAL_OINTMENT | CUTANEOUS | Status: DC | PRN
  Administered 2017-11-29: 1 via NASAL

## 2017-11-29 MED ORDER — ONDANSETRON HCL 4 MG/2ML IJ SOLN
INTRAMUSCULAR | Status: DC | PRN
  Administered 2017-11-29: 4 mg via INTRAVENOUS

## 2017-11-29 MED ORDER — PROPOFOL 10 MG/ML IV BOLUS
INTRAVENOUS | Status: DC | PRN
  Administered 2017-11-29: 100 mg via INTRAVENOUS
  Administered 2017-11-29: 30 mg via INTRAVENOUS

## 2017-11-29 MED ORDER — MUPIROCIN 2 % EX OINT
TOPICAL_OINTMENT | CUTANEOUS | Status: AC
  Filled 2017-11-29: qty 22

## 2017-11-29 MED ORDER — SUGAMMADEX SODIUM 200 MG/2ML IV SOLN
INTRAVENOUS | Status: DC | PRN
  Administered 2017-11-29: 200 mg via INTRAVENOUS

## 2017-11-29 MED ORDER — HYDROCODONE-ACETAMINOPHEN 5-325 MG PO TABS: 1.0000 | ORAL_TABLET | Freq: Four times a day (QID) | ORAL | 0 refills | Status: DC | PRN

## 2017-11-29 MED ORDER — OXYMETAZOLINE HCL 0.05 % NA SOLN
NASAL | Status: DC | PRN
  Administered 2017-11-29: 1 via TOPICAL

## 2017-11-29 MED ORDER — LIDOCAINE 2% (20 MG/ML) 5 ML SYRINGE
INTRAMUSCULAR | Status: DC | PRN
  Administered 2017-11-29: 100 mg via INTRAVENOUS

## 2017-11-29 MED ORDER — EPHEDRINE 5 MG/ML INJ
INTRAVENOUS | Status: AC
  Filled 2017-11-29: qty 10

## 2017-11-29 MED ORDER — OXYMETAZOLINE HCL 0.05 % NA SOLN
NASAL | Status: AC
  Filled 2017-11-29: qty 15

## 2017-11-29 MED ORDER — FENTANYL CITRATE (PF) 250 MCG/5ML IJ SOLN
INTRAMUSCULAR | Status: AC
  Filled 2017-11-29: qty 5

## 2017-11-29 MED ORDER — ROCURONIUM BROMIDE 100 MG/10ML IV SOLN
INTRAVENOUS | Status: DC | PRN
  Administered 2017-11-29: 50 mg via INTRAVENOUS

## 2017-11-29 MED ORDER — LIDOCAINE-EPINEPHRINE 1 %-1:100000 IJ SOLN
INTRAMUSCULAR | Status: AC
  Filled 2017-11-29: qty 1

## 2017-11-29 MED ORDER — SUGAMMADEX SODIUM 200 MG/2ML IV SOLN
INTRAVENOUS | Status: AC
  Filled 2017-11-29: qty 2

## 2017-11-29 MED ORDER — LACTATED RINGERS IV SOLN
INTRAVENOUS | Status: DC
  Administered 2017-11-29 (×2): via INTRAVENOUS

## 2017-11-29 MED ORDER — SODIUM CHLORIDE 0.9 % IV SOLN
3.0000 g | INTRAVENOUS | Status: AC
  Administered 2017-11-29: 3 g via INTRAVENOUS
  Filled 2017-11-29 (×2): qty 3

## 2017-11-29 MED ORDER — EPHEDRINE SULFATE 50 MG/ML IJ SOLN
INTRAMUSCULAR | Status: DC | PRN
  Administered 2017-11-29 (×2): 10 mg via INTRAVENOUS
  Administered 2017-11-29 (×2): 5 mg via INTRAVENOUS
  Administered 2017-11-29 (×4): 10 mg via INTRAVENOUS

## 2017-11-29 MED ORDER — DEXAMETHASONE SODIUM PHOSPHATE 4 MG/ML IJ SOLN
INTRAMUSCULAR | Status: DC | PRN
  Administered 2017-11-29: 10 mg via INTRAVENOUS

## 2017-11-29 MED ORDER — HYDROMORPHONE HCL 1 MG/ML IJ SOLN: 0.2500 mg | INTRAMUSCULAR | Status: DC | PRN

## 2017-11-29 SURGICAL SUPPLY — 70 items
ATTRACTOMAT 16X20 MAGNETIC DRP (DRAPES) IMPLANT
BLADE RAD 40 CVD SINUS 4MM (BLADE) IMPLANT
BLADE RAD40 ROTATE 4M 4 5PK (BLADE) IMPLANT
BLADE RAD40 ROTATE 4M 4MM 5PK (BLADE)
BLADE RAD60 ROTATE M4 4 5PK (BLADE) ×1 IMPLANT
BLADE RAD60 ROTATE M4 4MM 5PK (BLADE) ×1
BLADE ROTATE TRICUT 4MX13CM M4 (BLADE)
BLADE ROTATE TRICUT 4X13 M4 (BLADE) IMPLANT
BLADE SURG 15 STRL LF DISP TIS (BLADE) IMPLANT
BLADE SURG 15 STRL SS (BLADE)
BLADE TRICUT ROTATE M4 4 5PK (BLADE) ×2 IMPLANT
BLADE TRICUT ROTATE M4 4MM 5PK (BLADE) ×1
CANISTER SUCT 3000ML PPV (MISCELLANEOUS) ×3 IMPLANT
CLOSURE WOUND 1/2 X4 (GAUZE/BANDAGES/DRESSINGS) ×1
COAGULATOR SUCT 6 FR SWTCH (ELECTROSURGICAL)
COAGULATOR SUCT SWTCH 10FR 6 (ELECTROSURGICAL) IMPLANT
CRADLE DONUT ADULT HEAD (MISCELLANEOUS) ×2 IMPLANT
DRAPE HALF SHEET 40X57 (DRAPES) IMPLANT
DRESSING NASAL POPE 10X1.5X2.5 (GAUZE/BANDAGES/DRESSINGS) IMPLANT
DRESSING NASL FOAM PST OP SINU (MISCELLANEOUS) IMPLANT
DRESSING TELFA 8X10 (GAUZE/BANDAGES/DRESSINGS) IMPLANT
DRSG NASAL FOAM POST OP SINU (MISCELLANEOUS) ×3
DRSG NASAL POPE 10X1.5X2.5 (GAUZE/BANDAGES/DRESSINGS)
DRSG NASOPORE 8CM (GAUZE/BANDAGES/DRESSINGS) IMPLANT
ELECT REM PT RETURN 9FT ADLT (ELECTROSURGICAL)
ELECTRODE REM PT RTRN 9FT ADLT (ELECTROSURGICAL) IMPLANT
FILTER ARTHROSCOPY CONVERTOR (FILTER) ×4 IMPLANT
GAUZE SPONGE 4X4 12PLY STRL LF (GAUZE/BANDAGES/DRESSINGS) ×2 IMPLANT
GLOVE BIO SURGEON STRL SZ7.5 (GLOVE) ×3 IMPLANT
GLOVE BIOGEL PI IND STRL 6.5 (GLOVE) IMPLANT
GLOVE BIOGEL PI IND STRL 8 (GLOVE) IMPLANT
GLOVE BIOGEL PI INDICATOR 6.5 (GLOVE) ×2
GLOVE BIOGEL PI INDICATOR 8 (GLOVE) ×2
GOWN STRL REUS W/ TWL LRG LVL3 (GOWN DISPOSABLE) ×2 IMPLANT
GOWN STRL REUS W/TWL LRG LVL3 (GOWN DISPOSABLE) ×9
IMPL PROPEL CONTOUR (Prosthesis and Implant ENT) IMPLANT
IMPL PROPEL SINUS 23MML (Prosthesis and Implant ENT) IMPLANT
IMPLANT PROPEL CONTOUR (Prosthesis and Implant ENT) ×3 IMPLANT
IMPLANT PROPEL SINUS 23MML (Prosthesis and Implant ENT) ×6 IMPLANT
KIT BASIN OR (CUSTOM PROCEDURE TRAY) ×3 IMPLANT
KIT ROOM TURNOVER OR (KITS) ×3 IMPLANT
NDL HYPO 25GX1X1/2 BEV (NEEDLE) IMPLANT
NDL PRECISIONGLIDE 27X1.5 (NEEDLE) ×1 IMPLANT
NDL SPNL 22GX3.5 QUINCKE BK (NEEDLE) ×1 IMPLANT
NEEDLE HYPO 25GX1X1/2 BEV (NEEDLE) ×3 IMPLANT
NEEDLE PRECISIONGLIDE 27X1.5 (NEEDLE) ×3 IMPLANT
NEEDLE SPNL 22GX3.5 QUINCKE BK (NEEDLE) ×3 IMPLANT
NS IRRIG 1000ML POUR BTL (IV SOLUTION) ×3 IMPLANT
PAD ARMBOARD 7.5X6 YLW CONV (MISCELLANEOUS) ×6 IMPLANT
PAD ENT ADHESIVE 25PK (MISCELLANEOUS) ×1 IMPLANT
PATTIES SURGICAL .5 X3 (DISPOSABLE) ×3 IMPLANT
SHEATH ENDOSCRUB 0 DEG (SHEATH) ×3 IMPLANT
SHEATH ENDOSCRUB 30 DEG (SHEATH) ×3 IMPLANT
SHEATH ENDOSCRUB 45 DEG (SHEATH) ×2 IMPLANT
SOLUTION ANTI FOG 6CC (MISCELLANEOUS) ×3 IMPLANT
SPECIMEN JAR SMALL (MISCELLANEOUS) ×2 IMPLANT
SPLINT NASAL DOYLE BI-VL (GAUZE/BANDAGES/DRESSINGS) ×2 IMPLANT
STRIP CLOSURE SKIN 1/2X4 (GAUZE/BANDAGES/DRESSINGS) ×1 IMPLANT
SUT CHROMIC 3 0 PS 2 (SUTURE) ×2 IMPLANT
SUT CHROMIC 4 0 PS 2 18 (SUTURE) ×2 IMPLANT
SUT ETHILON 3 0 PS 1 (SUTURE) IMPLANT
SWAB COLLECTION DEVICE MRSA (MISCELLANEOUS) ×2 IMPLANT
SYR 50ML SLIP (SYRINGE) IMPLANT
TRACKER ENT INSTRUMENT (MISCELLANEOUS) ×2 IMPLANT
TRACKER ENT PATIENT (MISCELLANEOUS) ×2 IMPLANT
TRAY ENT MC OR (CUSTOM PROCEDURE TRAY) ×3 IMPLANT
TUBE CONNECTING 12'X1/4 (SUCTIONS) ×1
TUBE CONNECTING 12X1/4 (SUCTIONS) ×2 IMPLANT
TUBING STRAIGHTSHOT EPS 5PK (TUBING) ×2 IMPLANT
WATER STERILE IRR 1000ML POUR (IV SOLUTION) ×3 IMPLANT

## 2017-11-29 NOTE — Brief Op Note (Signed)
11/29/2017  1:34 PM  PATIENT:  Frank Ayala  77 y.o. male  PRE-OPERATIVE DIAGNOSIS:  CHRONIC PANSINUSITIS,NASAL POLYPS  POST-OPERATIVE DIAGNOSIS:  CHRONIC PANSINUSITIS,NASAL POLYPS  PROCEDURE:  Procedure(s): ENDOSCOPIC SINUS SURGERY WITH FUSION (N/A)  Bilateral total ethmoidectomy Bilateral frontal recess exploration Bilateral sphenoidotomy Bilateral maxillary antrostomy Fusion image guidance Septoplasty  SURGEON:  Surgeon(s) and Role:    [name], MD - Primary  PHYSICIAN ASSISTANT:   ASSISTANTS: none   ANESTHESIA:   general  EBL: 300  BLOOD ADMINISTERED:none  DRAINS: none   LOCAL MEDICATIONS USED:  LIDOCAINE   SPECIMEN:  Source of Specimen:  Sinus contents  DISPOSITION OF SPECIMEN:  PATHOLOGY  COUNTS:  YES  TOURNIQUET:  * No tourniquets in log *  DICTATION: .Other Dictation: Dictation Number [id number]  PLAN OF CARE: Discharge to home after PACU  PATIENT DISPOSITION:  PACU - hemodynamically stable.   Delay start of Pharmacological VTE agent (>24hrs) due to surgical blood loss or risk of bleeding: yes

## 2017-11-29 NOTE — Anesthesia Procedure Notes (Signed)
Procedure Name: Intubation Date/Time: 11/29/2017 11:21 AM Performed by: Lieutenant Diego, CRNA Pre-anesthesia Checklist: Patient identified, Emergency Drugs available, Suction available and Patient being monitored Patient Re-evaluated:Patient Re-evaluated prior to induction Oxygen Delivery Method: Circle system utilized Preoxygenation: Pre-oxygenation with 100% oxygen Induction Type: IV induction Ventilation: Mask ventilation without difficulty Laryngoscope Size: Miller and 2 Grade View: Grade I Tube type: Oral Tube size: 7.5 mm Number of attempts: 1 Airway Equipment and Method: Stylet and Oral airway Placement Confirmation: ETT inserted through vocal cords under direct vision,  positive ETCO2 and breath sounds checked- equal and bilateral Secured at: 23 cm Tube secured with: Tape Dental Injury: Teeth and Oropharynx as per pre-operative assessment

## 2017-11-29 NOTE — Anesthesia Preprocedure Evaluation (Signed)
Anesthesia Evaluation  Patient identified by MRN, date of birth, ID band Patient awake    Reviewed: Allergy & Precautions, NPO status , Patient's Chart, lab work & pertinent test results  Airway Mallampati: I  TM Distance: >3 FB Neck ROM: Full    Dental   Pulmonary COPD,    Pulmonary exam normal        Cardiovascular + Past MI  Normal cardiovascular exam     Neuro/Psych    GI/Hepatic   Endo/Other    Renal/GU      Musculoskeletal   Abdominal   Peds  Hematology   Anesthesia Other Findings   Reproductive/Obstetrics                             Anesthesia Physical Anesthesia Plan  ASA: III  Anesthesia Plan: General   Post-op Pain Management:    Induction: Intravenous  PONV Risk Score and Plan: 2  Airway Management Planned: Oral ETT  Additional Equipment:   Intra-op Plan:   Post-operative Plan: Extubation in OR  Informed Consent: I have reviewed the patients History and Physical, chart, labs and discussed the procedure including the risks, benefits and alternatives for the proposed anesthesia with the patient or authorized representative who has indicated his/her understanding and acceptance.     Plan Discussed with: CRNA and Surgeon  Anesthesia Plan Comments:         Anesthesia Quick Evaluation

## 2017-11-29 NOTE — H&P (Signed)
Frank Ayala is an 77 y.o. male.   Chief Complaint: Chronic sinusitis HPI: 77 year old male with ongoing sinonasal inflammatory symptoms for several years.  He was found to have chronic pansinusitis by CT that has not responded fully to medications.  He presents for surgical management.  Past Medical History:  Diagnosis Date  . Asthma    asthmatic symptoms, but "I don't have asthma"  . CAD in native artery    a. NSTEMI 02/2016 with occ mLAD & occ small PDA both with collaterals, treated medically  . Cancer ([place])    skin cancer removed   . COPD (chronic obstructive pulmonary disease) ([place])   . Hyperlipidemia   . Ischemic cardiomyopathy    a. 02/2016: EF 45-50% by cath, 40-45% by echo.  . Pancreatic lesion     Past Surgical History:  Procedure Laterality Date  . CARDIAC CATHETERIZATION N/A 03/07/2016   Procedure: Left Heart Cath and Coronary Angiography;  Surgeon: [name], MD;  Location: [place] [facility];  Service: Cardiovascular;  Laterality: N/A;  . knee surgery     left knee reconstruction  . LEFT HEART CATH AND CORONARY ANGIOGRAPHY N/A 06/09/2017   Procedure: LEFT HEART CATH AND CORONARY ANGIOGRAPHY;  Surgeon: [name], MD;  Location: [place] [facility];  Service: Cardiovascular;  Laterality: N/A;  . skin cancer removed     back of left neck    Family History  Problem Relation Age of Onset  . Cancer Father    Social History:  reports that  has never smoked. he has never used smokeless tobacco. He reports that he does not drink alcohol or use drugs.  Allergies:  Allergies  Allergen Reactions  . Aspirin Shortness Of Breath  . Nsaids Shortness Of Breath  . Tolmetin Shortness Of Breath  . Isosorbide Dinitrate     Severe migraine headache  . Isosorbide Mononitrate [Isosorbide Dinitrate Er] Other (See Comments)    Severe migraine headache  . Nitroglycerin Other (See Comments)    Pt reported BP dropped very low when he took oral NTG  . Lisinopril  Cough    Medications Prior to Admission  Medication Sig Dispense Refill  . atorvastatin (LIPITOR) 80 MG tablet Take 1 tablet (80 mg total) by mouth daily at 6 PM. 30 tablet 11  . clopidogrel (PLAVIX) 75 MG tablet TAKE 1 TABLET BY MOUTH DAILY 30 tablet 10  . Fluticasone-Salmeterol (ADVAIR) 250-50 MCG/DOSE AEPB Inhale 1 puff into the lungs 2 (two) times daily as needed (shortness of breath). Reported on 04/19/2016    . lipase/protease/amylase (CREON) 36000 UNITS CPEP capsule Take 1-2 capsules by mouth 3 (three) times daily with meals. Take 2 capsules by mouth with each meal and take 1 capsule with each snack    . losartan (COZAAR) 50 MG tablet Take 1 tablet (50 mg total) by mouth daily. 30 tablet 11  . metoprolol succinate (TOPROL-XL) 25 MG 24 hr tablet Take 12.5 mg by mouth daily.    . Multiple Vitamin (MULTIVITAMIN WITH MINERALS) TABS tablet Take 1 tablet by mouth daily.    . Omega-3 Fatty Acids (FISH OIL) 1200 MG CAPS Take 1,200 mg by mouth daily.    . Testosterone (FORTESTA) 10 MG/ACT (2%) GEL Place 40 mg onto the skin every evening. 4 pumps    . acetaminophen (TYLENOL) 500 MG tablet Take 1,000 mg by mouth daily as needed for moderate pain or headache.    . albuterol (PROVENTIL HFA;VENTOLIN HFA) 108 (90 Base)  MCG/ACT inhaler Inhale 1-2 puffs into the lungs every 6 (six) hours as needed for wheezing or shortness of breath.    Marland Kitchen albuterol (PROVENTIL) (2.5 MG/3ML) 0.083% nebulizer solution Take 2.5 mg by nebulization every 6 (six) hours as needed for wheezing or shortness of breath.    . Potassium Gluconate 595 MG CAPS Take 595 mg by mouth daily as needed (cramps).    . triamcinolone (NASACORT) 55 MCG/ACT AERO nasal inhaler Place 2 sprays into the nose daily.    . vitamin E 400 UNIT capsule Take 400 Units by mouth daily.      No results found for this or any previous visit (from the past 48 hour(s)). No results found.  Review of Systems  All other systems reviewed and are negative.   Blood  pressure 125/65, pulse 62, temperature 97.6 F (36.4 C), resp. rate 20, height 5\' 9"  (1.753 m), weight 205 lb 6.4 oz (93.2 kg), SpO2 99 %. Physical Exam  Constitutional: He is oriented to person, place, and time. He appears well-developed and well-nourished. No distress.  HENT:  Head: Normocephalic and atraumatic.  Right Ear: External ear normal.  Left Ear: External ear normal.  Nose: Nose normal.  Mouth/Throat: Oropharynx is clear and moist.  Eyes: Conjunctivae and EOM are normal. Pupils are equal, round, and reactive to light.  Neck: Normal range of motion. Neck supple.  Cardiovascular: Normal rate.  Respiratory: Effort normal.  Musculoskeletal: Normal range of motion.  Neurological: He is alert and oriented to person, place, and time. No cranial nerve deficit.  Skin: Skin is warm and dry.  Psychiatric: He has a normal mood and affect. His behavior is normal. Judgment and thought content normal.     Assessment/Plan Chronic pansinusitis To OR for endoscopic sinus surgery.  [name], MD 11/29/2017, 11:02 AM

## 2017-11-29 NOTE — Anesthesia Postprocedure Evaluation (Signed)
Anesthesia Post Note  Patient: Frank Ayala  Procedure(s) Performed: ENDOSCOPIC SINUS SURGERY WITH FUSION (N/A Nose) SEPTOPLASTY (N/A Nose)     Patient location during evaluation: PACU Anesthesia Type: General Level of consciousness: awake and alert Pain management: pain level controlled Vital Signs Assessment: post-procedure vital signs reviewed and stable Respiratory status: spontaneous breathing, nonlabored ventilation, respiratory function stable and patient connected to nasal cannula oxygen Cardiovascular status: blood pressure returned to baseline and stable Postop Assessment: no apparent nausea or vomiting Anesthetic complications: no    Last Vitals:  Vitals:   11/29/17 1504 11/29/17 1515  BP: 137/74 (!) 145/73  Pulse: 87 93  Resp: 15 15  Temp: 36.5 C   SpO2: 94% 93%    Last Pain:  Vitals:   11/29/17 1504  PainSc: 0-No pain                 Audri Kozub

## 2017-11-29 NOTE — Transfer of Care (Signed)
Immediate Anesthesia Transfer of Care Note  Patient: Frank Ayala  Procedure(s) Performed: ENDOSCOPIC SINUS SURGERY WITH FUSION (N/A Nose)  Patient Location: PACU  Anesthesia Type:General  Level of Consciousness: drowsy  Airway & Oxygen Therapy: Patient Spontanous Breathing and Patient connected to face mask oxygen  Post-op Assessment: Report given to RN and Post -op Vital signs reviewed and stable  Post vital signs: Reviewed and stable  Last Vitals:  Vitals:   11/29/17 0858 11/29/17 1350  BP: 125/65 (!) 134/57  Pulse: 62 91  Resp: 20 12  Temp: 36.4 C (!) 36.3 C  SpO2: 99% 99%    Last Pain: There were no vitals filed for this visit.       Complications: No apparent anesthesia complications

## 2017-11-30 NOTE — Op Note (Signed)
NAME:  Frank Ayala, Frank Ayala                    ACCOUNT NO.:  MEDICAL RECORD NO.:  [id number]  LOCATION:                                 FACILITY:  PHYSICIAN:  Onnie Graham, MD     DATE OF BIRTH:  Jun 15, 1941  DATE OF PROCEDURE:  11/29/2017 DATE OF DISCHARGE:                              OPERATIVE REPORT   PREOPERATIVE DIAGNOSIS:  Chronic polypoid pansinusitis.  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  PROCEDURE: 1. Bilateral total ethmoidectomy. 2. Bilateral frontal recess exploration. 3. Bilateral sphenoidotomy. 4. Bilateral maxillary antrostomy. 5. Fusion image guidance. 6. Septoplasty.  SURGEON:  Onnie Graham, MD.  ANESTHESIA:  General endotracheal anesthesia.  COMPLICATIONS:  None.  INDICATION:  The patient is a 77 year old male with a long history of chronic sinonasal symptoms with limited benefit from medical therapy recently.  Imaging demonstrates inflammatory changes involving all of the sinuses with polypoid tissues seen as well.  He presents to the operating room for surgical management.  FINDINGS:  There were prominent polyps in both middle meatus regions, worse on the left side with the polyps on the left fully obstructing the nasal passage.  There were polypoid changes throughout the ethmoids on both sides as well as within the sphenoid sinuses and frontal recesses. The maxillary sinuses did not have polyp inside so much, but there was pus in the left maxillary sinus and a culture was taken.  After polyp was removed from the left side of the nose, the septal deviation became quite evident and prominent and so, a septoplasty was performed that revealed polyp posterior to the deviation that was not seen prior to the septoplasty.  Propel implants were placed in both ethmoids and in the left frontal recess.  DESCRIPTION OF PROCEDURE:  The patient was identified in the holding room.  Informed consent having been  obtained including discussion of risks, benefits, alternatives, the patient was brought to the operative suite and put on operating table in supine position.  Anesthesia was induced and the patient was intubated by the Anesthesia team without difficulty.  The patient was given intravenous antibiotics and steroids during the case.  The eyes were lubricated and the face was prepped and draped in sterile fashion.  This was done after placing the antenna on the forehead for the Fusion system.  The patient was then registered to the Fusion system in the standard fashion and then Afrin pledgets were placed in both sides of the nose and the eyes were taped closed with Steri-Strips.  After instruments were registered, the right nasal pledgets were removed and a 0-degree telescope used to evaluate the nasal passage.  The lateral nasal wall was injected with 1% lidocaine with 1:100,000 epinephrine in a standard fashion.  A polyp within the middle turbinate was then removed using the microdebrider back into the ethmoid region.  The uncinate process was retroflexed and was removed using the microdebrider.  The ethmoid bulla was removed and ethmoid septations were taken down through the basal lamella and back to the sphenoid rostrum.  Image guidance was used to assist with this.  An angled telescope was then used to  view the lateral nasal wall and the natural maxillary opening was identified and widened using the microdebrider to create a wide antrostomy.  Middle turbinate was then lateralized and polyp tissue was removed from the superior meatus using a microdebrider.  The superior turbinate was partially resected also with a microdebrider exposing the sphenoid rostrum where the natural sphenoid opening was identified and widened superiorly and laterally resulting in a wide sphenoidotomy.  The middle turbinate was remedialized and the skull base was then followed in a retrograde fashion using an  angled telescope and angled microdebrider, removing septations from along the skull base to the frontal recess.  The image guidance was used in this process.  The frontal recess was then cleared out of polypoid tissue and septations using microdebrider and was widely patent.  Afrin pledgets were placed in the ethmoid cavity.  The same procedure was then carried on the left side including removal of polyps from the middle meatus, injection of local anesthetic in the lateral nasal wall, removal of polyp and septations through the anterior and then posterior ethmoid, widening of the natural maxillary ostium and removal of the uncinate process, removal of polyps from the superior meatus and partial removal of the superior turbinate, widening of the natural sphenoid opening superiorly and laterally, and retrograde dissection of the skull base using the angled telescope and angled debrider, and removal of polyp and septations from the frontal recess. On the left side, the polyps were more prominent.  There was pus found in the left maxillary sinus and a culture swab was taken before the maxillary was opened further.  The lining within the maxillary sinus had a bumpy appearance.  Dissection of the frontal recess was more challenging on the left side where it was narrower.  At this point, it became obvious that the septum deviation was obstructing the posterior portion of the dissection and so the septum was injected on both sides using local anesthetic.  A Killian incision was made in the left side and subperichondrial flap was elevated down the left side.  The cartilaginous portion of the inferior spur was then removed using the Soil scientist and [employer] forceps.  The bony cartilaginous junction posteriorly was divided and some of the posterior bone removed in piecemeal fashion inferiorly.  This included removing the large septal spur to the left that was largely bony.  The osteotome was used  to remove some of the septal bone inferiorly as well.  A small tear resulted in the left septal flap.  With tissues now removed, the 0- degree telescope was again used to evaluate the left sinus cavity.  An additional polyp tissue was removed from the posterior ethmoid using a microdebrider.  At this point, Afrin pledgets placed in the left ethmoid cavity.  The right pledgets were removed and after few minutes, the left was removed as well.  The frontal recess propel was then inserted into the left frontal recess and deployed.  Ethmoid propels were then placed in both ethmoid cavities and deployed.  Stammberger pack was then hydrated and placed in each ethmoid cavity.  The nose and throat were then suctioned and the nose was suctioned further.  Doyle splints coated in Bactroban ointment placed in both nasal passages.  The Killian incision was closed with 4-0 chromic in a simple interrupted fashion. The stents were placed in a proper position and secured with a single 2- 0 chromic sutures in a mattress suture.  At this point, the throat was suctioned and  the patient was returned to anesthesia for wake up.  He was cleaned off and then extubated and moved to the recovery room in stable condition.     Onnie Graham, MD    DDB/MEDQ  D:  11/29/2017  T:  11/30/2017  Job:  [id number]  cc:   Onnie Graham, MD

## 2017-12-01 ENCOUNTER — Encounter (HOSPITAL_COMMUNITY): Payer: Self-pay | Admitting: Otolaryngology

## 2017-12-02 LAB — CULTURE, ROUTINE-SINUS: CULTURE: NORMAL

## 2017-12-12 ENCOUNTER — Encounter: Payer: Self-pay | Admitting: Physician Assistant

## 2017-12-12 NOTE — Progress Notes (Addendum)
Cardiology Office Note    Date:  12/14/2017  ID:  Frank Ayala, DOB [birth year], MRN [id number] PCP:  Frank Ebbs, MD  Cardiologist:  Dr. Angelena Ayala   Chief Complaint: 6 mo f/u CAD  History of Present Illness:  Frank Ayala is a 77 y.o. 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 Most recent ischemic assessment was by cath 05/2017. He had undergone a nuclear stress test for pre-op clearance for pancreatic lesion surgery (which has not yet occurred). This was abnormal as expected, but given the extent of the surgery, this prompted cath which showed stable CAD with 100% RPDA, 100% mLAD, 70% ostial 1st septal, EF 45-50%, normal LVEDP. Continued medical therapy was recommended. Last labs showed K 5.2 slight hemolysis, Cr 1.01, Hgb 14.5 (11/24/17), LDL 73 and normal LFTs (05/2017).  The patient returns for follow-up with his wife today feeling well. He has continued to exercise daily on a stepper without any chest pain, SOB, diaphoresis, palpitations or syncope. He also does yardwork, cuts down trees, and remains very active. His [facility] has been rescheduled to March. This was originally planned last summer but since he had a trip to [place] planned in November, it was delayed. He and his wife thought it would be planned for December but state that [facility] has set a date in March.    Past Medical History:  Diagnosis Date  . Asthma    asthmatic symptoms, but "I don't have asthma"  . CAD in native artery    a. NSTEMI 02/2016 with occ mLAD & occ small PDA both with collaterals, treated medically. b. Abn nuc 2018 -> stable cath, also 70% 1st septal -> med rx. EF 45-50%.  . Cancer ([place])    skin cancer removed   . COPD (chronic obstructive pulmonary disease) ([place])   .  Hyperlipidemia   . Ischemic cardiomyopathy    a. 02/2016: EF 45-50% by cath, 40-45% by echo. b. Cath 2018: EF 45-50%.  . Pancreatic lesion     Past Surgical History:  Procedure Laterality Date  . CARDIAC CATHETERIZATION N/A 03/07/2016   Procedure: Left Heart Cath and Coronary Angiography;  Surgeon: Frank M Martinique, MD;  Location: [facility];  Service: Cardiovascular;  Laterality: N/A;  . knee surgery     left knee reconstruction  . LEFT HEART CATH AND CORONARY ANGIOGRAPHY N/A 06/09/2017   Procedure: LEFT HEART CATH AND CORONARY ANGIOGRAPHY;  Surgeon: Frank Blanks, MD;  Location: [facility];  Service: Cardiovascular;  Laterality: N/A;  . SEPTOPLASTY N/A 11/29/2017   Procedure: SEPTOPLASTY;  Surgeon: Frank Quitter, MD;  Location: [facility];  Service: ENT;  Laterality: N/A;  . SINUS ENDO WITH FUSION N/A 11/29/2017   Procedure: ENDOSCOPIC SINUS SURGERY WITH FUSION;  Surgeon: Frank Quitter, MD;  Location: [facility];  Service: ENT;  Laterality: N/A;  . skin cancer removed     back of left neck    Current Medications: Current Meds  Medication Sig  . acetaminophen (TYLENOL) 500 MG tablet Take 1,000 mg by mouth daily as needed for moderate pain or headache.  . albuterol (PROVENTIL HFA;VENTOLIN HFA) 108 (90 Base) MCG/ACT inhaler Inhale 1-2 puffs into the lungs every 6 (six) hours as needed for wheezing or shortness of breath.  Marland Kitchen albuterol (PROVENTIL) (  2.5 MG/3ML) 0.083% nebulizer solution Take 2.5 mg by nebulization every 6 (six) hours as needed for wheezing or shortness of breath.  Marland Kitchen atorvastatin (LIPITOR) 80 MG tablet Take 1 tablet (80 mg total) by mouth daily at 6 PM.  . clopidogrel (PLAVIX) 75 MG tablet TAKE 1 TABLET BY MOUTH DAILY  . fluticasone (FLONASE) 50 MCG/ACT nasal spray as directed.  . Fluticasone-Salmeterol (ADVAIR) 250-50 MCG/DOSE AEPB Inhale 1 puff into the lungs 2 (two) times daily as needed (shortness of breath). Reported on 04/19/2016  . lipase/protease/amylase  (CREON) 36000 UNITS CPEP capsule Take 1-2 capsules by mouth 3 (three) times daily with meals. Take 2 capsules by mouth with each meal and take 1 capsule with each snack  . metoprolol succinate (TOPROL-XL) 25 MG 24 hr tablet Take 12.5 mg by mouth daily.  . Multiple Vitamin (MULTIVITAMIN WITH MINERALS) TABS tablet Take 1 tablet by mouth daily.  . Omega-3 Fatty Acids (FISH OIL) 1200 MG CAPS Take 1,200 mg by mouth daily.  . Potassium Gluconate 595 MG CAPS Take 595 mg by mouth daily as needed (cramps).  . Testosterone (FORTESTA) 10 MG/ACT (2%) GEL Place 40 mg onto the skin every evening. 4 pumps  . vitamin E 400 UNIT capsule Take 400 Units by mouth daily.    Allergies:   Aspirin; Nsaids; Tolmetin; Isosorbide dinitrate; Isosorbide mononitrate [isosorbide dinitrate er]; Nitroglycerin; and Lisinopril   Social History   Socioeconomic History  . Marital status: Married    Spouse name: None  . Number of children: None  . Years of education: None  . Highest education level: None  Social Needs  . Financial resource strain: None  . Food insecurity - worry: None  . Food insecurity - inability: None  . Transportation needs - medical: None  . Transportation needs - non-medical: None  Occupational History  . Occupation: Retired Nature conservation officer and [employer]  Tobacco Use  . Smoking status: Never Smoker  . Smokeless tobacco: Never Used  Substance and Sexual Activity  . Alcohol use: No  . Drug use: No  . Sexual activity: None  Other Topics Concern  . None  Social History Narrative   Lives in [place] with wife     Family History:  Family History  Problem Relation Age of Onset  . Cancer Father     ROS:   Please see the history of present illness.  All other systems are reviewed and otherwise negative.    PHYSICAL EXAM:   VS:  BP 128/70   Pulse 63   Ht 5\' 9"  (1.753 m)   Wt 203 lb 8 oz (92.3 kg)   SpO2 99%   BMI 30.05 kg/m   BMI: Body mass index is 30.05 kg/m. GEN: Well nourished, well developed  WM, in no acute distress  HEENT: normocephalic, atraumatic Neck: no JVD, carotid bruits, or masses Cardiac: RRR; no murmurs, rubs, or gallops, no edema  Respiratory:  clear to auscultation bilaterally, normal work of breathing GI: soft, nontender, nondistended, + BS MS: no deformity or atrophy  Skin: warm and dry, no rash Neuro:  Alert and Oriented x 3, Strength and sensation are intact, follows commands Psych: euthymic mood, full affect  Wt Readings from Last 3 Encounters:  12/14/17 203 lb 8 oz (92.3 kg)  11/29/17 205 lb 6.4 oz (93.2 kg)  11/24/17 205 lb 6.4 oz (93.2 kg)      Studies/Labs Reviewed:   EKG:  EKG was ordered today and personally reviewed by me and demonstrates SB 59bpm one  PAC otherwise normal  Recent Labs: 05/24/2017: ALT 16 11/24/2017: BUN 14; Creatinine, Ser 1.01; Hemoglobin 14.5; Platelets 474; Potassium 5.2; Sodium 138   Lipid Panel    Component Value Date/Time   CHOL 123 05/24/2017 0737   TRIG 68 05/24/2017 0737   HDL 36 (L) 05/24/2017 0737   CHOLHDL 3.4 05/24/2017 0737   CHOLHDL 5.1 (H) 04/29/2016 1004   VLDL 28 04/29/2016 1004   LDLCALC 73 05/24/2017 0737    Additional studies/ records that were reviewed today include: Summarized above   ASSESSMENT & PLAN:   1. CAD - doing well without symptoms. Dr. Angelena Ayala has maintained him long term on Plavix. He is also doing well on statin, metoprolol and losartan. Pre-operative evaluation - he is able to complete 7.25 METS without any exertional angina or functional limitation. Recent cath was stable at which time he was cleared for the same surgery. Based on ACC/AHA guidelines, CRISTAN SCHERZER would be at acceptable risk for the planned procedure without further cardiovascular testing. I will reach out to Dr. Angelena Ayala to get definitive word on his Plavix then plan to send a note to Dr. Maudie Mercury with [facility]. We have not formally been asked to weigh in again on pre-op clearance but I think it's important to document in  the chart that as of right now he's doing well and stable to proceed as of today. Addendum: per d/w Dr. Angelena Ayala, [place] to hold Plavix 5-7 days prior to surgery if needed. Of note, he came off Plavix for nasal surgery fairly recently and had no cardiac complications at that time. Will route this note to surgeon's office via [facility] fax function. Please call with questions. 2. Hyperkalemia - likely hemolysis on labs earlier this month but will repeat to ensure stability. 3. Hyperlipidemia - he reports lipids have been followed in the interim by PCP since last check. Will be due for repeat late summer/early fall. 4. Ischemic cardiomyopathy - continue BB, ARB. Appears euvolemic. No signs of CHF on exam.  Disposition: F/u with Dr. Angelena Ayala in 6 months.   Medication Adjustments/Labs and Tests Ordered: Current medicines are reviewed at length with the patient today.  Concerns regarding medicines are outlined above. Medication changes, Labs and Tests ordered today are summarized above and listed in the Patient Instructions accessible in Encounters.   Signed, Charlie Pitter, PA-C  12/14/2017 10:20 AM    [facility] [address], [place], [place]  [address] Phone: [phone]; Fax: [phone]

## 2017-12-14 ENCOUNTER — Encounter: Payer: Self-pay | Admitting: Physician Assistant

## 2017-12-14 ENCOUNTER — Ambulatory Visit (INDEPENDENT_AMBULATORY_CARE_PROVIDER_SITE_OTHER): Payer: Medicare Other | Admitting: Physician Assistant

## 2017-12-14 VITALS — BP 128/70 | HR 63 | Ht 69.0 in | Wt 203.5 lb

## 2017-12-14 DIAGNOSIS — E875 Hyperkalemia: Secondary | ICD-10-CM | POA: Diagnosis not present

## 2017-12-14 DIAGNOSIS — I255 Ischemic cardiomyopathy: Secondary | ICD-10-CM | POA: Diagnosis not present

## 2017-12-14 DIAGNOSIS — E785 Hyperlipidemia, unspecified: Secondary | ICD-10-CM

## 2017-12-14 DIAGNOSIS — Z01810 Encounter for preprocedural cardiovascular examination: Secondary | ICD-10-CM | POA: Diagnosis not present

## 2017-12-14 DIAGNOSIS — I2510 Atherosclerotic heart disease of native coronary artery without angina pectoris: Secondary | ICD-10-CM | POA: Diagnosis not present

## 2017-12-14 LAB — BASIC METABOLIC PANEL
BUN/Creatinine Ratio: 15 (ref 10–24)
BUN: 14 mg/dL (ref 8–27)
CALCIUM: 9.1 mg/dL (ref 8.6–10.2)
CO2: 26 mmol/L (ref 20–29)
Chloride: 103 mmol/L (ref 96–106)
Creatinine, Ser: 0.92 mg/dL (ref 0.76–1.27)
GFR calc non Af Amer: 81 mL/min/{1.73_m2} (ref >59)
GFR, EST AFRICAN AMERICAN: 93 mL/min/{1.73_m2} (ref >59)
Glucose: 94 mg/dL (ref 65–99)
POTASSIUM: 4.7 mmol/L (ref 3.5–5.2)
Sodium: 142 mmol/L (ref 134–144)

## 2017-12-14 NOTE — Patient Instructions (Addendum)
Medication Instructions:  Your physician recommends that you continue on your current medications as directed. Please refer to the Current Medication list given to you today.   Labwork: TODAY:  BMET  Testing/Procedures: None ordered  Follow-Up: Your physician wants you to follow-up in: 6 MONTHS WITH DR. MCALHANY   You will receive a reminder letter in the mail two months in advance. If you don't receive a letter, please call our office to schedule the follow-up appointment.   Any Other Special Instructions Will Be Listed Below (If Applicable).     If you need a refill on your cardiac medications before your next appointment, please call your pharmacy.   

## 2018-01-02 ENCOUNTER — Telehealth: Payer: Self-pay | Admitting: Cardiovascular Disease

## 2018-01-02 MED ORDER — LOSARTAN POTASSIUM 50 MG PO TABS: 50.0000 mg | ORAL_TABLET | Freq: Every day | ORAL | 3 refills | Status: DC

## 2018-01-02 NOTE — Telephone Encounter (Signed)
Patient states that he received a letter from [facility] stating that the manufacturer that they use for his losartan was affected by the recall. Called and spoke to the pharmacist at [facility] on [address]. He states that they have losartan 50 mg tablets that have not been affected by the recall. Called and made patient aware. Rx sent to [facility] on [address] Patient verbalized understanding and thanked me for the call.

## 2018-01-02 NOTE — Telephone Encounter (Signed)
Left message for patient to call back  

## 2018-01-02 NOTE — Telephone Encounter (Signed)
New message     Pt c/o medication issue:  1. Name of Medication: losartan (COZAAR) 50 MG tablet  2. How are you currently taking this medication (dosage and times per day)? Take 1 tablet (50 mg total) by mouth daily.  3. Are you having a reaction (difficulty breathing--STAT)? No  4. What is your medication issue? Patient got letter from [facility] regarding recall

## 2018-01-12 MED ORDER — METOPROLOL SUCCINATE ER 25 MG PO TB24
12.50 mg | ORAL_TABLET | ORAL | Status: DC
Start: 2018-01-13 — End: 2018-01-12

## 2018-01-12 MED ORDER — ALBUTEROL SULFATE HFA 108 (90 BASE) MCG/ACT IN AERS
2.00 | INHALATION_SPRAY | RESPIRATORY_TRACT | Status: DC
Start: ? — End: 2018-01-12

## 2018-01-12 MED ORDER — HEPARIN SODIUM (PORCINE) 5000 UNIT/ML IJ SOLN
5000.00 | INTRAMUSCULAR | Status: DC
Start: 2018-01-12 — End: 2018-01-12

## 2018-01-12 MED ORDER — PANCRELIPASE (LIP-PROT-AMYL) 24000-76000 UNITS PO CPEP
3.00 | ORAL_CAPSULE | ORAL | Status: DC
Start: 2018-01-12 — End: 2018-01-12

## 2018-01-12 MED ORDER — PANCRELIPASE (LIP-PROT-AMYL) 24000-76000 UNITS PO CPEP
1.00 | ORAL_CAPSULE | ORAL | Status: DC
Start: ? — End: 2018-01-12

## 2018-01-12 MED ORDER — ATORVASTATIN CALCIUM 20 MG PO TABS
20.00 mg | ORAL_TABLET | ORAL | Status: DC
Start: 2018-01-13 — End: 2018-01-12

## 2018-01-12 MED ORDER — CLOPIDOGREL BISULFATE 75 MG PO TABS
75.00 mg | ORAL_TABLET | ORAL | Status: DC
Start: 2018-01-13 — End: 2018-01-12

## 2018-01-12 MED ORDER — LOSARTAN POTASSIUM 25 MG PO TABS
50.00 mg | ORAL_TABLET | ORAL | Status: DC
Start: 2018-01-13 — End: 2018-01-12

## 2018-01-31 ENCOUNTER — Encounter: Payer: Self-pay | Admitting: Physician Assistant

## 2018-02-06 ENCOUNTER — Encounter (INDEPENDENT_AMBULATORY_CARE_PROVIDER_SITE_OTHER): Payer: Self-pay

## 2018-02-06 ENCOUNTER — Ambulatory Visit (INDEPENDENT_AMBULATORY_CARE_PROVIDER_SITE_OTHER): Payer: Medicare Other | Admitting: Physician Assistant

## 2018-02-06 VITALS — BP 120/70 | HR 70 | Ht 69.0 in | Wt 205.0 lb

## 2018-02-06 DIAGNOSIS — I9788 Other intraoperative complications of the circulatory system, not elsewhere classified: Secondary | ICD-10-CM

## 2018-02-06 DIAGNOSIS — I9589 Other hypotension: Secondary | ICD-10-CM | POA: Diagnosis not present

## 2018-02-06 DIAGNOSIS — I255 Ischemic cardiomyopathy: Secondary | ICD-10-CM

## 2018-02-06 DIAGNOSIS — E782 Mixed hyperlipidemia: Secondary | ICD-10-CM | POA: Diagnosis not present

## 2018-02-06 DIAGNOSIS — I2510 Atherosclerotic heart disease of native coronary artery without angina pectoris: Secondary | ICD-10-CM

## 2018-02-06 MED ORDER — LOSARTAN POTASSIUM 25 MG PO TABS: 25.0000 mg | ORAL_TABLET | Freq: Every day | ORAL | 3 refills | Status: DC

## 2018-02-06 NOTE — Patient Instructions (Addendum)
Medication Instructions:  1) DECREASE LOSARTAN to 25 mg daily   Labwork: None  Testing/Procedures: None  Follow-Up: You have an appointment with Dr. Angelena Form scheduled Monday, August 26th at 2:40PM. Please arrive 15 minutes prior to your appointment time.   Any Other Special Instructions Will Be Listed Below (If Applicable).     If you need a refill on your cardiac medications before your next appointment, please call your pharmacy.

## 2018-02-06 NOTE — Progress Notes (Signed)
Cardiology Office Note    Date:  02/06/2018   ID:  Frank Ayala, DOB 1941-02-15, MRN [id number]  PCP:  [name], MD  Cardiologist: [name], MD  Chief Complaint  Patient presents with  . Hospitalization Follow-up    History of Present Illness:  Frank Ayala is a 77 y.o. 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  Patient underwent stress testing 05/2017 for preop clearance for pancreatic surgery.  This was abnormal and he underwent cath which showed stable CAD with 100% RPDA, 100% mLAD, 70% ostial 1st septal, EF 45-50%, normal LVEDP. Continued medical therapy was recommended.  Last saw Frank Ku, PA-C 12/14/17 for preoperative clearance before undergoing Whipple procedure at [facility].  Patient went to have his Whipple procedure but it was canceled because of profound hypotension during epidural anesthesia requiring multiple pressors.  No EKG changes.  TEE with no obvious cardiac infarction. Echo 01/12/18 LVEF 50-55% with mild distal anterior septal hypokinesis. Troponins negative. They are no longer going to operate.     Past Medical History:  Diagnosis Date  . Asthma    asthmatic symptoms, but "I don't have asthma"  . CAD in native artery    a. NSTEMI 02/2016 with occ mLAD & occ small PDA both with collaterals, treated medically. b. Abn nuc 2018 -> stable cath, also 70% 1st septal -> med rx. EF 45-50%.  . Cancer ([place])    skin cancer removed   . COPD (chronic obstructive pulmonary disease) ([place])   . Hyperlipidemia   . Ischemic cardiomyopathy    a. 02/2016: EF 45-50% by cath, 40-45% by echo. b. Cath 2018: EF 45-50%.  . Pancreatic lesion     Past Surgical History:  Procedure Laterality Date  . CARDIAC CATHETERIZATION N/A 03/07/2016   Procedure: Left Heart Cath  and Coronary Angiography;  Surgeon: [name], MD;  Location: [place] [facility];  Service: Cardiovascular;  Laterality: N/A;  . knee surgery     left knee reconstruction  . LEFT HEART CATH AND CORONARY ANGIOGRAPHY N/A 06/09/2017   Procedure: LEFT HEART CATH AND CORONARY ANGIOGRAPHY;  Surgeon: [name], MD;  Location: [place] [facility];  Service: Cardiovascular;  Laterality: N/A;  . SEPTOPLASTY N/A 11/29/2017   Procedure: SEPTOPLASTY;  Surgeon: [name], MD;  Location: [place];  Service: ENT;  Laterality: N/A;  . SINUS ENDO WITH FUSION N/A 11/29/2017   Procedure: ENDOSCOPIC SINUS SURGERY WITH FUSION;  Surgeon: [name], MD;  Location: [place];  Service: ENT;  Laterality: N/A;  . skin cancer removed     back of left neck    Current Medications: Current Meds  Medication Sig  . acetaminophen (TYLENOL) 500 MG tablet Take 1,000 mg by mouth daily as needed for moderate pain or headache.  . albuterol (PROVENTIL HFA;VENTOLIN HFA) 108 (90 Base) MCG/ACT inhaler Inhale 1-2 puffs into the lungs every 6 (six) hours as needed for wheezing or shortness of breath.  Marland Kitchen albuterol (PROVENTIL) (2.5 MG/3ML) 0.083% nebulizer solution Take 2.5 mg by nebulization every 6 (six) hours as needed for wheezing or shortness of breath.  Marland Kitchen atorvastatin (LIPITOR) 80 MG tablet Take 1 tablet (80 mg total) by mouth daily at 6 PM.  . clopidogrel (PLAVIX) 75 MG tablet TAKE 1 TABLET BY MOUTH DAILY  . lipase/protease/amylase (CREON) 36000  UNITS CPEP capsule Take 1-2 capsules by mouth 3 (three) times daily with meals. Take 2 capsules by mouth with each meal and take 1 capsule with each snack  . losartan (COZAAR) 25 MG tablet Take 1 tablet (25 mg total) by mouth daily.  . metoprolol succinate (TOPROL-XL) 25 MG 24 hr tablet Take 12.5 mg by mouth daily.  . Multiple Vitamin (MULTIVITAMIN WITH MINERALS) TABS tablet Take 1 tablet by mouth daily.  . Omega-3 Fatty Acids (FISH OIL) 1200 MG CAPS Take 1,200 mg by mouth  daily.  . Potassium Gluconate 595 MG CAPS Take 595 mg by mouth daily as needed (cramps).  . Testosterone (FORTESTA) 10 MG/ACT (2%) GEL Place 40 mg onto the skin every evening. 4 pumps  . [DISCONTINUED] losartan (COZAAR) 50 MG tablet Take 1 tablet (50 mg total) by mouth daily.     Allergies:   Aspirin; Nsaids; Tolmetin; Isosorbide dinitrate; Isosorbide mononitrate [isosorbide dinitrate er]; Nitroglycerin; and Lisinopril   Social History   Socioeconomic History  . Marital status: Married    Spouse name: Not on file  . Number of children: Not on file  . Years of education: Not on file  . Highest education level: Not on file  Occupational History  . Occupation: Retired Nature conservation officer and [employer]  Social Needs  . Financial resource strain: Not on file  . Food insecurity:    Worry: Not on file    Inability: Not on file  . Transportation needs:    Medical: Not on file    Non-medical: Not on file  Tobacco Use  . Smoking status: Never Smoker  . Smokeless tobacco: Never Used  Substance and Sexual Activity  . Alcohol use: No  . Drug use: No  . Sexual activity: Not on file  Lifestyle  . Physical activity:    Days per week: Not on file    Minutes per session: Not on file  . Stress: Not on file  Relationships  . Social connections:    Talks on phone: Not on file    Gets together: Not on file    Attends religious service: Not on file    Active member of club or organization: Not on file    Attends meetings of clubs or organizations: Not on file    Relationship status: Not on file  Other Topics Concern  . Not on file  Social History Narrative   Lives in [place] with wife     Family History:  The patient's family history includes Cancer in his father.   ROS:   Please see the history of present illness.    Review of Systems  Constitution: Negative.  HENT: Negative.   Cardiovascular: Negative.   Respiratory: Negative.   Endocrine: Negative.   Hematologic/Lymphatic: Negative.     Musculoskeletal: Negative.   Gastrointestinal: Negative.   Genitourinary: Negative.   Neurological: Negative.    All other systems reviewed and are negative.   PHYSICAL EXAM:   VS:  BP 120/70   Pulse 70   Ht 5\' 9"  (1.753 m)   Wt 205 lb (93 kg)   SpO2 97%   BMI 30.27 kg/m   Physical Exam  GEN: Well nourished, well developed, in no acute distress  Neck: no JVD, carotid bruits, or masses Cardiac:RRR; no murmurs, rubs, or gallops  Respiratory:  clear to auscultation bilaterally, normal work of breathing GI: soft, nontender, nondistended, + BS Ext: without cyanosis, clubbing, or edema, Good distal pulses bilaterally Neuro:  Alert and Oriented x 3  Psych: euthymic mood, full affect  Wt Readings from Last 3 Encounters:  02/06/18 205 lb (93 kg)  12/14/17 203 lb 8 oz (92.3 kg)  11/29/17 205 lb 6.4 oz (93.2 kg)      Studies/Labs Reviewed:   EKG:  EKG is not ordered today.   Recent Labs: 05/24/2017: ALT 16 11/24/2017: Hemoglobin 14.5; Platelets 474 12/14/2017: BUN 14; Creatinine, Ser 0.92; Potassium 4.7; Sodium 142   Lipid Panel    Component Value Date/Time   CHOL 123 05/24/2017 0737   TRIG 68 05/24/2017 0737   HDL 36 (L) 05/24/2017 0737   CHOLHDL 3.4 05/24/2017 0737   CHOLHDL 5.1 (H) 04/29/2016 1004   VLDL 28 04/29/2016 1004   LDLCALC 73 05/24/2017 0737    Additional studies/ records that were reviewed today include:  2D echo [facility] 01/12/18   Technically difficult study due to chest wall/lung interference  Overall normal left ventricular systolic function, ejection fraction  50-55%  Mild distal anteroseptal hypokinesis  Mitral annular calcification  Dilated left atrium - mild  Normal right ventricular systolic function  Iliac catheterization 8/2018Conclusion      Ost RPDA lesion, 100 %stenosed.  Mid LAD lesion, 100 %stenosed.  Ost 1st Sept to 1st Sept lesion, 70 %stenosed.  The left ventricular ejection fraction is 45-50% by visual estimate.  There is  mild left ventricular systolic dysfunction.  LV end diastolic pressure is normal.  There is no mitral valve regurgitation.   1. Chronic occlusion of the mid LAD just after a large diagonal branch. The mid and distal LAD fills from right to left collaterals and from left to left collaterals.  2. 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 Mild LV systolic dysfunction with apical hypokinesis.  4. Normal LV filling pressures   Recommendations: His CAD is stable. No good options for treatment of chronic LAD occlusion which explains the abnormal findings on his nuclear stress test. Continue medical management of CAD. He can proceed with his planned surgical procedure.        ASSESSMENT:    1. Hypotension during surgery   2. CAD in native artery   3. Mixed hyperlipidemia      PLAN:  In order of problems listed above:  Hypotension when under epidural anesthesia for pancreatic surgery at [facility] requiring multiple pressors and surgery aborted.  No plans for further surgery.  Echo showed normal LVEF with wall motion abnormality in the anterior wall consistent with prior echoes.  Patient's blood pressure is 120/70.  He denies any dizziness or presyncopal symptoms.  We will decrease losartan to 25 mg once daily.  Patient will check his blood pressure at home and keep a log and bring them into [place].  Follow-up with Dr. Angelena Form in 3-4 months.  CAD cath 05/2017 done for preoperative clearance with chronic occlusion of the mid LAD fills from right to left collaterals and left to left collaterals, chronic occlusion of a very small PDA, mild LV dysfunction with apical hypokinesis, normal LV filling pressures.  No angina.  Continue Lipitor, Plavix Toprol  Mixed hyperlipidemia on Lipitor LDL 73 05/2017    Medication Adjustments/Labs and Tests Ordered: Current medicines are reviewed at length with the patient today.  Concerns regarding medicines are outlined above.  Medication changes, Labs and Tests ordered  today are listed in the Patient Instructions below. Patient Instructions  Medication Instructions:  1) DECREASE LOSARTAN to 25 mg daily   Labwork: None  Testing/Procedures: None  Follow-Up: You have an appointment with Dr.  McAlhany scheduled Monday, August 26th at 2:40PM. Please arrive 15 minutes prior to your appointment time.   Any Other Special Instructions Will Be Listed Below (If Applicable).     If you need a refill on your cardiac medications before your next appointment, please call your pharmacy.      Signed, Ermalinda Barrios, PA-C  02/06/2018 1:51 PM    [facility] [place] [address], [place], [place]  [address] Phone: [phone]; Fax: [phone]

## 2018-02-26 IMAGING — DX DG CHEST 2V
2 series · 2 of 2 positions shown · non-contrast
Comparison: None.

CLINICAL DATA: Right-sided chest pain and right upper quadrant
abdominal pain since 11 p.m. last night.

EXAM:
CHEST  2 VIEW

[w chest pa]
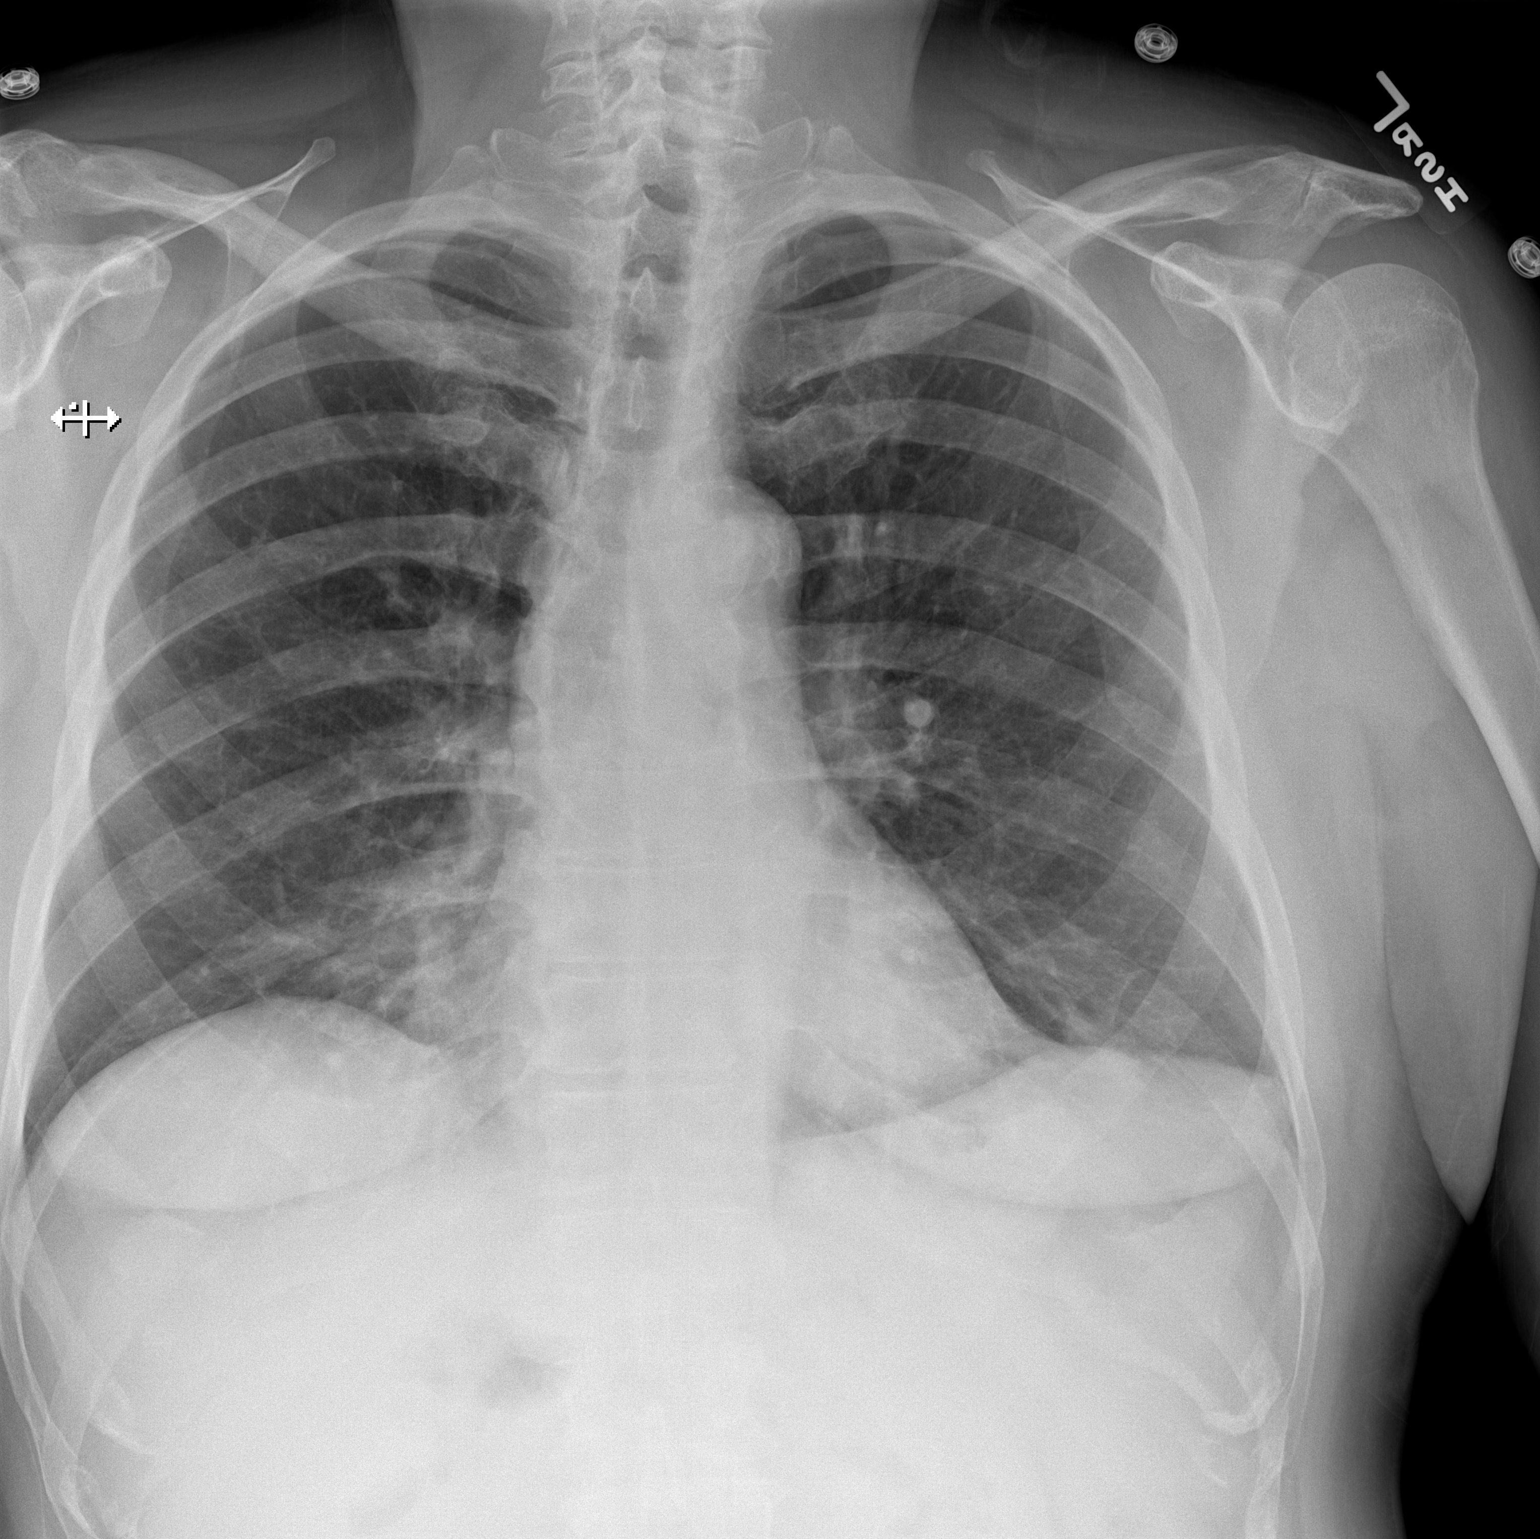

[w chest lat]
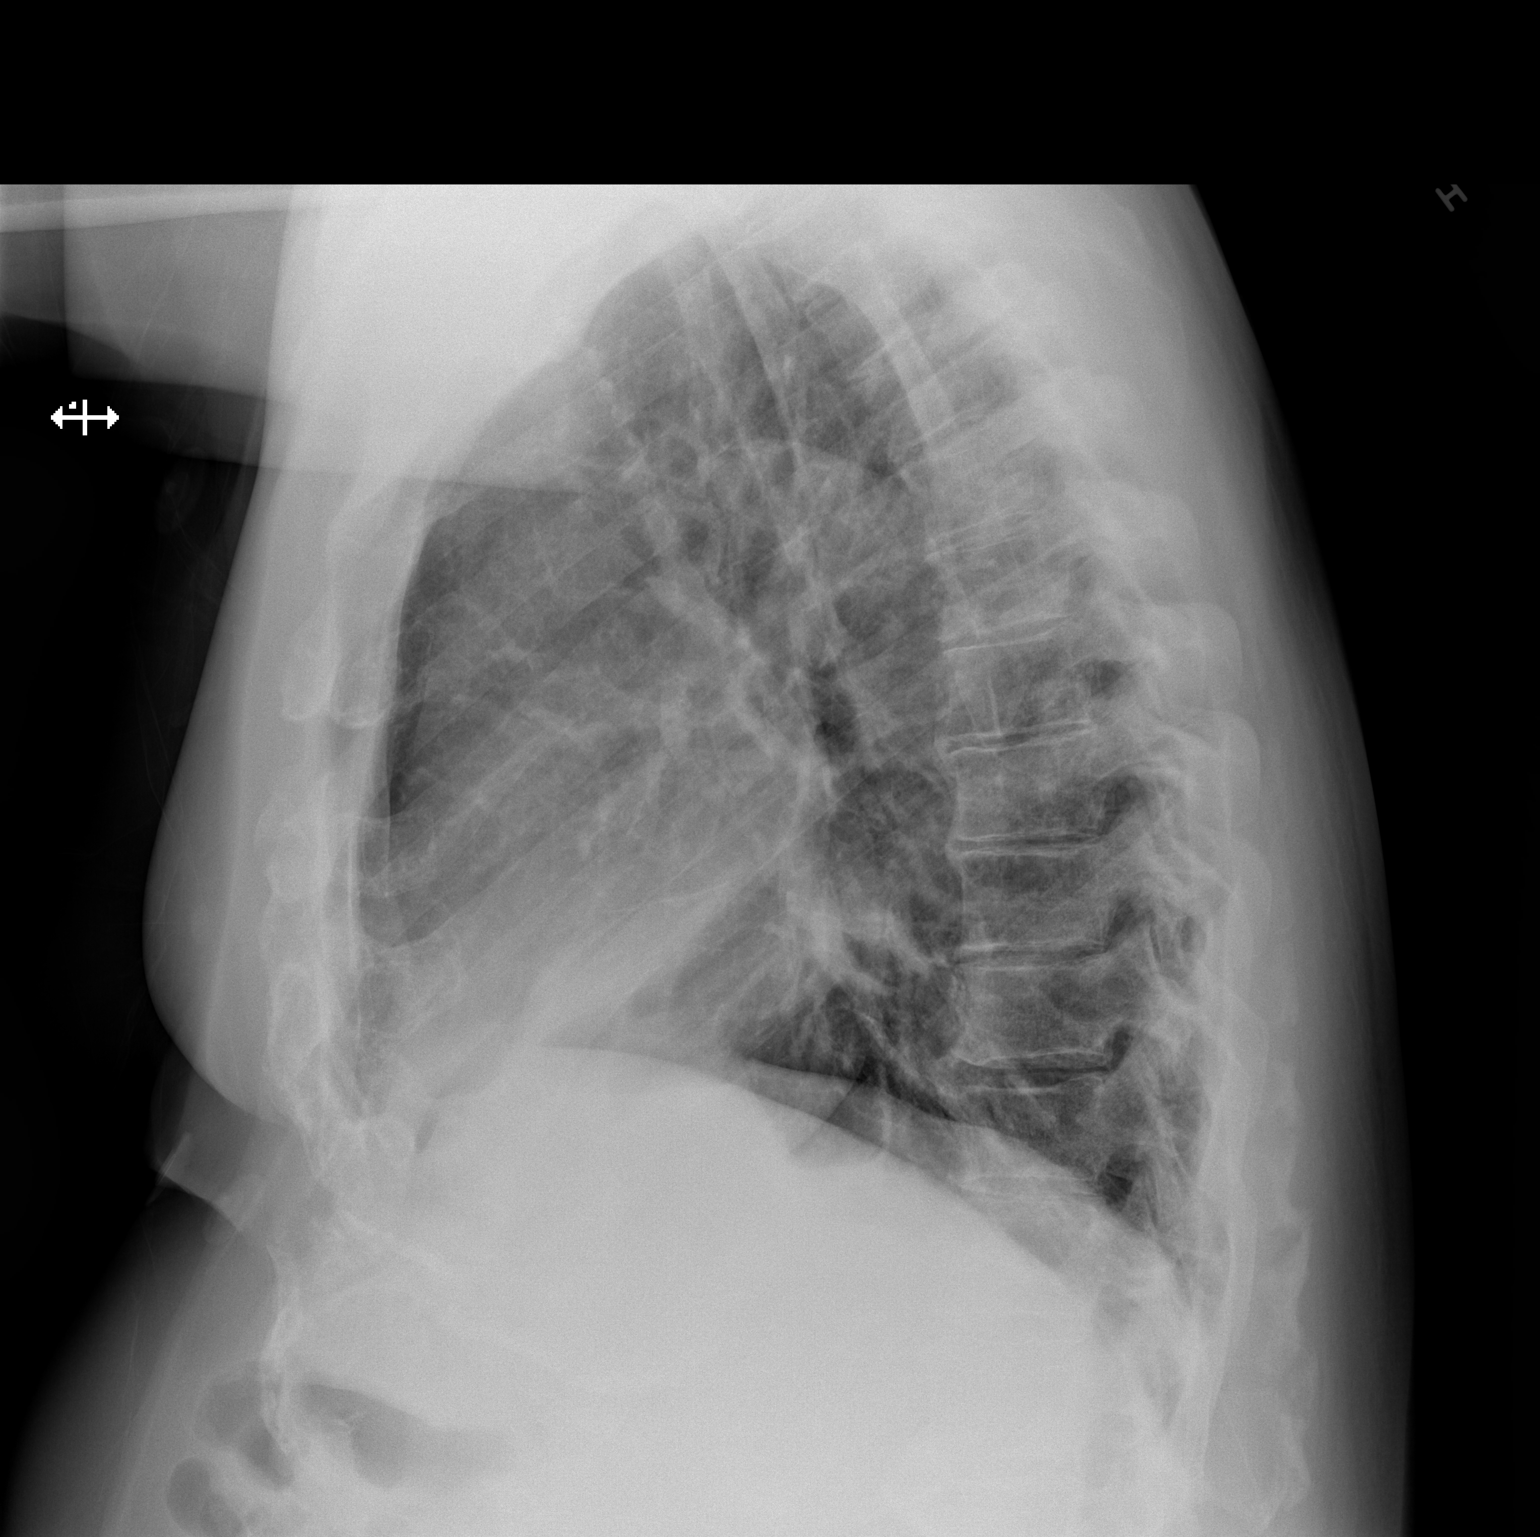

[2 of 2 positions shown; findings below may reference images not displayed]

FINDINGS: The heart size and mediastinal contours are within normal limits.
Linear atelectasis in the lung bases. No focal consolidation.
Degenerative changes in the spine.
IMPRESSION: Atelectasis in the lung bases.  No active consolidation.

## 2018-06-18 ENCOUNTER — Ambulatory Visit (INDEPENDENT_AMBULATORY_CARE_PROVIDER_SITE_OTHER): Payer: Medicare Other | Admitting: Cardiovascular Disease

## 2018-06-18 VITALS — BP 116/68 | HR 82 | Ht 69.0 in | Wt 206.0 lb

## 2018-06-18 DIAGNOSIS — I2510 Atherosclerotic heart disease of native coronary artery without angina pectoris: Secondary | ICD-10-CM | POA: Diagnosis not present

## 2018-06-18 DIAGNOSIS — E7800 Pure hypercholesterolemia, unspecified: Secondary | ICD-10-CM | POA: Diagnosis not present

## 2018-06-18 DIAGNOSIS — I255 Ischemic cardiomyopathy: Secondary | ICD-10-CM | POA: Diagnosis not present

## 2018-06-18 NOTE — Progress Notes (Signed)
Chief Complaint  Patient presents with  . Follow-up    CAD     History of Present Illness: 77 yo male with history of COPD, asthma, hyperlipidemia, CAD, ischemic cardiomyopathy who is here today for follow up. He was admitted to [facility] 03/07/16 with a NSTEMI and underwent cardiac cath on 03/07/16 per Dr. Martinique. This showed 100% occlusion of the mid LAD and 100% occlusion of the small PDA, both vessels filling from collaterals. His LAD occlusion was felt to be more than 12 hours from presentation so the plan was for medical management. LVEF=40-45% by echo. He was not started on ASA due to intolerance.  Repeat echo March 2018 with LVEF=50-55%. 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 Surgery planned in the fall of 2018 but delayed due to his heart disease. Stress testing was considered mandatory prior to his surgery by anesthesia at [facility] given his extensive CAD. As expected, his nuclear stress test was abnormal. His nuclear stress test showed a reversible defect in the mid anteroseptal/mid inferoseptal and true apical defect, possibly due to ischemia. He was having no chest pain when I saw him in the office 06/11/17 but given his stress test findings, I repeated his cardiac cath on 06/09/17. This showed continued occlusion of the mid LAD and ostial right PDA. No options for PCI. He was having his Whipple procedure performed March 2019 but he developed hypotension during epidural anesthesia requiring pressors and the surgery was not started. They have elected not to perform the Whipple procedure. He has been told his tumor is benign and no plans for resection now. He has not been offered chemotherapy and he thinks this is because of his heart disease.   He is here today for follow up. The patient denies any chest pain, dyspnea, palpitations, lower extremity edema, orthopnea, PND, dizziness, near syncope or syncope.    Primary Care  Physician: [name], MD   Past Medical History:  Diagnosis Date  . Asthma    asthmatic symptoms, but "I don't have asthma"  . CAD in native artery    a. NSTEMI 02/2016 with occ mLAD & occ small PDA both with collaterals, treated medically. b. Abn nuc 2018 -> stable cath, also 70% 1st septal -> med rx. EF 45-50%.  . Cancer ([place])    skin cancer removed   . COPD (chronic obstructive pulmonary disease) ([place])   . Hyperlipidemia   . Ischemic cardiomyopathy    a. 02/2016: EF 45-50% by cath, 40-45% by echo. b. Cath 2018: EF 45-50%.  . Pancreatic lesion     Past Surgical History:  Procedure Laterality Date  . CARDIAC CATHETERIZATION N/A 03/07/2016   Procedure: Left Heart Cath and Coronary Angiography;  Surgeon: [name], MD;  Location: [place] [facility];  Service: Cardiovascular;  Laterality: N/A;  . knee surgery     left knee reconstruction  . LEFT HEART CATH AND CORONARY ANGIOGRAPHY N/A 06/09/2017   Procedure: LEFT HEART CATH AND CORONARY ANGIOGRAPHY;  Surgeon: [name], MD;  Location: [place] [facility];  Service: Cardiovascular;  Laterality: N/A;  . SEPTOPLASTY N/A 11/29/2017   Procedure: SEPTOPLASTY;  Surgeon: [name], MD;  Location: [place];  Service: ENT;  Laterality: N/A;  . SINUS ENDO WITH FUSION N/A 11/29/2017   Procedure: ENDOSCOPIC SINUS SURGERY WITH FUSION;  Surgeon: [name], MD;  Location: [place];  Service: ENT;  Laterality: N/A;  .  skin cancer removed     back of left neck    Current Outpatient Medications  Medication Sig Dispense Refill  . acetaminophen (TYLENOL) 500 MG tablet Take 1,000 mg by mouth daily as needed for moderate pain or headache.    . albuterol (PROVENTIL HFA;VENTOLIN HFA) 108 (90 Base) MCG/ACT inhaler Inhale 1-2 puffs into the lungs every 6 (six) hours as needed for wheezing or shortness of breath.    Marland Kitchen albuterol (PROVENTIL) (2.5 MG/3ML) 0.083% nebulizer solution Take 2.5 mg by nebulization every 6 (six) hours as needed for  wheezing or shortness of breath.    Marland Kitchen atorvastatin (LIPITOR) 80 MG tablet Take 1 tablet (80 mg total) by mouth daily at 6 PM. 30 tablet 11  . clopidogrel (PLAVIX) 75 MG tablet TAKE 1 TABLET BY MOUTH DAILY 30 tablet 10  . lipase/protease/amylase (CREON) 36000 UNITS CPEP capsule Take 1-2 capsules by mouth 3 (three) times daily with meals. Take 2 capsules by mouth with each meal and take 1 capsule with each snack    . losartan (COZAAR) 25 MG tablet Take 1 tablet (25 mg total) by mouth daily. 90 tablet 3  . metoprolol succinate (TOPROL-XL) 25 MG 24 hr tablet Take 12.5 mg by mouth daily.    . Multiple Vitamin (MULTIVITAMIN WITH MINERALS) TABS tablet Take 1 tablet by mouth daily.    . Omega-3 Fatty Acids (FISH OIL) 1200 MG CAPS Take 1,200 mg by mouth daily.    . Potassium Gluconate 595 MG CAPS Take 595 mg by mouth daily as needed (cramps).    . Testosterone (FORTESTA) 10 MG/ACT (2%) GEL Place 40 mg onto the skin every evening. 4 pumps     No current facility-administered medications for this visit.     Allergies  Allergen Reactions  . Aspirin Shortness Of Breath  . Nsaids Shortness Of Breath  . Tolmetin Shortness Of Breath  . Isosorbide Dinitrate     Severe migraine headache  . Isosorbide Mononitrate [Isosorbide Dinitrate Er] Other (See Comments)    Severe migraine headache  . Nitroglycerin Other (See Comments)    Pt reported BP dropped very low when he took oral NTG  . Lisinopril Cough    Social History   Socioeconomic History  . Marital status: Married    Spouse name: Not on file  . Number of children: Not on file  . Years of education: Not on file  . Highest education level: Not on file  Occupational History  . Occupation: Retired Nature conservation officer and [employer]  Social Needs  . Financial resource strain: Not on file  . Food insecurity:    Worry: Not on file    Inability: Not on file  . Transportation needs:    Medical: Not on file    Non-medical: Not on file  Tobacco Use  . Smoking  status: Never Smoker  . Smokeless tobacco: Never Used  Substance and Sexual Activity  . Alcohol use: No  . Drug use: No  . Sexual activity: Not on file  Lifestyle  . Physical activity:    Days per week: Not on file    Minutes per session: Not on file  . Stress: Not on file  Relationships  . Social connections:    Talks on phone: Not on file    Gets together: Not on file    Attends religious service: Not on file    Active member of club or organization: Not on file    Attends meetings of clubs or organizations: Not on  file    Relationship status: Not on file  . Intimate partner violence:    Fear of current or ex partner: Not on file    Emotionally abused: Not on file    Physically abused: Not on file    Forced sexual activity: Not on file  Other Topics Concern  . Not on file  Social History Narrative   Lives in [place] with wife    Family History  Problem Relation Age of Onset  . Cancer Father     Review of Systems:  As stated in the HPI and otherwise negative.   BP 116/68   Pulse 82   Ht 5\' 9"  (1.753 m)   Wt 206 lb (93.4 kg)   BMI 30.42 kg/m   Physical Examination:  General: Well developed, well nourished, NAD  HEENT: OP clear, mucus membranes moist  SKIN: warm, dry. No rashes. Neuro: No focal deficits  Musculoskeletal: Muscle strength 5/5 all ext  Psychiatric: Mood and affect normal  Neck: No JVD, no carotid bruits, no thyromegaly, no lymphadenopathy.  Lungs:Clear bilaterally, no wheezes, rhonci, crackles Cardiovascular: Regular rate and rhythm. No murmurs, gallops or rubs. Abdomen:Soft. Bowel sounds present. Non-tender.  Extremities: No lower extremity edema. Pulses are 2 + in the bilateral DP/PT.  Cardiac cath 06/09/17:  Ost RPDA lesion, 100 %stenosed.  Mid LAD lesion, 100 %stenosed.  Ost 1st Sept to 1st Sept lesion, 70 %stenosed.  The left ventricular ejection fraction is 45-50% by visual estimate.  There is mild left ventricular systolic  dysfunction.  LV end diastolic pressure is normal.  There is no mitral valve regurgitation.   1. Chronic occlusion of the mid LAD just after a large diagonal branch. The mid and distal LAD fills from right to left collaterals and from left to left collaterals.  2. 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 Mild LV systolic dysfunction with apical hypokinesis.  4. Normal LV filling pressures  Echo March 2018: - Left ventricle: The cavity size was normal. Wall thickness was   increased in a pattern of moderate LVH. Systolic function was   normal. The estimated ejection fraction was in the range of 50%   to 55%. Mild anteroseptal hypokinesis. Doppler parameters are   consistent with abnormal left ventricular relaxation (grade 1   diastolic dysfunction). The E/e&' ratio is between 8-15,   suggesting indeterminate LV filling pressure. - Mitral valve: Calcified annulus. Mildly thickened leaflets .   There was trivial regurgitation. - Left atrium: The atrium was normal in size. - Right atrium: The atrium was mildly dilated. - Inferior vena cava: The vessel was normal in size. The   respirophasic diameter changes were in the normal range (>= 50%),   consistent with normal central venous pressure.  Impressions:  - Compared to a prior study in 02/2016, the LVEF has improved to   50-55% (although there is still mild anteroseptal hypokinesis).  EKG:  EKG is not ordered today. The ekg ordered today demonstrates   Recent Labs: 11/24/2017: Hemoglobin 14.5; Platelets 474 12/14/2017: BUN 14; Creatinine, Ser 0.92; Potassium 4.7; Sodium 142   Lipid Panel    Component Value Date/Time   CHOL 123 05/24/2017 0737   TRIG 68 05/24/2017 0737   HDL 36 (L) 05/24/2017 0737   CHOLHDL 3.4 05/24/2017 0737   CHOLHDL 5.1 (H) 04/29/2016 1004   VLDL 28 04/29/2016 1004   LDLCALC 73 05/24/2017 0737     Wt Readings from Last 3 Encounters:  06/18/18 206 lb (93.4 kg)  02/06/18 205 lb (93 kg)  12/14/17 203  lb 8 oz (92.3 kg)     Other studies Reviewed: Additional studies/ records that were reviewed today include: . Review of the above records demonstrates:   Assessment and Plan:   1. CAD without angina: No chest pain. He is known to have chronic occlusion of the mid LAD and right PDA.  He has mild LV dysfunction due to his chronic LAD occlusion. Last LVEF=45-50% by cath in August 2018. He is doing well from a cardiac standpoint. I would expect that his chance of survival from his cardiac condition is reasonable to estimate at 10 years or more. I would continue with workup/treatment in regards to his pancreatic mass as needed and not limit this due to his heart disease.  Will continue medical management of CAD with Plavix, statin and beta blocker. No ASA due to intolerance in past.     2. Ischemic cardiomyopathy: Echo March 2018 with LVEF of 50-55%. LVEF=45-50% by LV gram in August 2018. Continue beta blocker and ARB.   3. Hyperlipidemia: Lipids controlled last year. Will get most recent labs from primary care today. Continue statin.   Current medicines are reviewed at length with the patient today.  The patient does not have concerns regarding medicines.  The following changes have been made:  no change  Labs/ tests ordered today include:   No orders of the defined types were placed in this encounter.   Disposition:   FU with me in 6 months  Signed, [name], MD 06/18/2018 3:30 PM    [address], [place], [place]  [address] Phone: [phone]; Fax: [phone]

## 2018-06-18 NOTE — Patient Instructions (Signed)
Medication Instructions:  Your physician recommends that you continue on your current medications as directed. Please refer to the Current Medication list given to you today.   Labwork: none  Testing/Procedures: none  Follow-Up: Your physician recommends that you schedule a follow-up appointment in: 6 months. Please call our office in about 2 months to schedule this appointment   Any Other Special Instructions Will Be Listed Below (If Applicable).     If you need a refill on your cardiac medications before your next appointment, please call your pharmacy.   

## 2018-06-20 ENCOUNTER — Telehealth: Payer: Self-pay | Admitting: Cardiovascular Disease

## 2018-06-20 NOTE — Telephone Encounter (Signed)
Labs received from [place] office. Placed in [facility] doc box.

## 2019-01-04 ENCOUNTER — Ambulatory Visit (INDEPENDENT_AMBULATORY_CARE_PROVIDER_SITE_OTHER): Payer: Medicare Other | Admitting: Cardiovascular Disease

## 2019-01-04 ENCOUNTER — Other Ambulatory Visit: Payer: Self-pay

## 2019-01-04 ENCOUNTER — Encounter: Payer: Self-pay | Admitting: Cardiovascular Disease

## 2019-01-04 VITALS — BP 108/70 | HR 71 | Ht 69.0 in | Wt 209.2 lb

## 2019-01-04 DIAGNOSIS — I2510 Atherosclerotic heart disease of native coronary artery without angina pectoris: Secondary | ICD-10-CM

## 2019-01-04 DIAGNOSIS — E7800 Pure hypercholesterolemia, unspecified: Secondary | ICD-10-CM

## 2019-01-04 DIAGNOSIS — I255 Ischemic cardiomyopathy: Secondary | ICD-10-CM | POA: Diagnosis not present

## 2019-01-04 NOTE — Patient Instructions (Signed)
Medication Instructions:  Your physician recommends that you continue on your current medications as directed. Please refer to the Current Medication list given to you today.  If you need a refill on your cardiac medications before your next appointment, please call your pharmacy.   Lab work: none If you have labs (blood work) drawn today and your tests are completely normal, you will receive your results only by: Marland Kitchen MyChart Message (if you have MyChart) OR . A paper copy in the mail If you have any lab test that is abnormal or we need to change your treatment, we will call you to review the results.  Testing/Procedures: none  Follow-Up: At [facility], you and your health needs are our priority.  As part of our continuing mission to provide you with exceptional heart care, we have created designated Provider Care Teams.  These Care Teams include your primary Cardiologist (physician) and Advanced Practice Providers (APPs -  Physician Assistants and Nurse Practitioners) who all work together to provide you with the care you need, when you need it. You will need a follow up appointment in 10 -14 months.  Please call our office 2 months in advance to schedule this appointment.  You may see [name], MD or one of the following Advanced Practice Providers on your designated Care Team:   [place], PA-C Melina Copa, PA-C . Ermalinda Barrios, PA-C  Any Other Special Instructions Will Be Listed Below (If Applicable).

## 2019-01-04 NOTE — Progress Notes (Signed)
Chief Complaint  Patient presents with  . Follow-up    CAD   History of Present Illness: 78 yo male with history of COPD, asthma, hyperlipidemia, CAD and ischemic cardiomyopathy who is here today for follow up. He was admitted to [facility] 03/07/16 with a NSTEMI and underwent cardiac cath on 03/07/16 per Dr. Martinique. This showed 100% occlusion of the mid LAD and 100% occlusion of the small PDA, both vessels filling from collaterals. His LAD occlusion was felt to be more than 12 hours from presentation so the plan was for medical management. LVEF=40-45% by echo. He was not started on ASA due to intolerance.  Repeat echo March 2018 with LVEF=50-55%. 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 Surgery planned in the fall of 2018 but delayed due to his heart disease. Stress testing was considered mandatory prior to his surgery by anesthesia at [facility] given his extensive CAD. As expected, his nuclear stress test was abnormal. His nuclear stress test showed a reversible defect in the mid anteroseptal/mid inferoseptal and true apical defect, possibly due to ischemia. He was having no chest pain when I saw him in the office 06/11/17 but given his stress test findings, I repeated his cardiac cath on 06/09/17. This showed continued occlusion of the mid LAD and ostial right PDA. No options for PCI. He was having his Whipple procedure performed March 2019 but he developed hypotension during induction of anesthesia requiring pressors and the surgery was not started. They have elected not to perform the Whipple procedure. He has been told his tumor is benign and no plans for resection now.   He is here today for follow up. The patient denies any chest pain, dyspnea, palpitations, lower extremity edema, orthopnea, PND, dizziness, near syncope or syncope.    Primary Care Physician: [name], MD   Past Medical History:  Diagnosis Date  . Asthma     asthmatic symptoms, but "I don't have asthma"  . CAD in native artery    a. NSTEMI 02/2016 with occ mLAD & occ small PDA both with collaterals, treated medically. b. Abn nuc 2018 -> stable cath, also 70% 1st septal -> med rx. EF 45-50%.  . Cancer ([place])    skin cancer removed   . COPD (chronic obstructive pulmonary disease) ([place])   . Hyperlipidemia   . Ischemic cardiomyopathy    a. 02/2016: EF 45-50% by cath, 40-45% by echo. b. Cath 2018: EF 45-50%.  . Pancreatic lesion     Past Surgical History:  Procedure Laterality Date  . CARDIAC CATHETERIZATION N/A 03/07/2016   Procedure: Left Heart Cath and Coronary Angiography;  Surgeon: [name], MD;  Location: [facility];  Service: Cardiovascular;  Laterality: N/A;  . knee surgery     left knee reconstruction  . LEFT HEART CATH AND CORONARY ANGIOGRAPHY N/A 06/09/2017   Procedure: LEFT HEART CATH AND CORONARY ANGIOGRAPHY;  Surgeon: [name], MD;  Location: [place] [facility];  Service: Cardiovascular;  Laterality: N/A;  . SEPTOPLASTY N/A 11/29/2017   Procedure: SEPTOPLASTY;  Surgeon: [name], MD;  Location: [place] [facility];  Service: ENT;  Laterality: N/A;  . SINUS ENDO WITH FUSION N/A 11/29/2017   Procedure: ENDOSCOPIC SINUS SURGERY WITH FUSION;  Surgeon: [name], MD;  Location: [facility];  Service: ENT;  Laterality: N/A;  . skin cancer removed     back of left neck    Current  Outpatient Medications  Medication Sig Dispense Refill  . acetaminophen (TYLENOL) 500 MG tablet Take 1,000 mg by mouth daily as needed for moderate pain or headache.    . ADVAIR DISKUS 250-50 MCG/DOSE AEPB Inhale 1 puff into the lungs every 12 (twelve) hours.    Marland Kitchen albuterol (PROVENTIL HFA;VENTOLIN HFA) 108 (90 Base) MCG/ACT inhaler Inhale 1-2 puffs into the lungs every 6 (six) hours as needed for wheezing or shortness of breath.    Marland Kitchen albuterol (PROVENTIL) (2.5 MG/3ML) 0.083% nebulizer solution Take 2.5 mg by nebulization every 6 (six) hours as  needed for wheezing or shortness of breath.    Marland Kitchen atorvastatin (LIPITOR) 80 MG tablet Take 1 tablet (80 mg total) by mouth daily at 6 PM. 30 tablet 11  . clopidogrel (PLAVIX) 75 MG tablet TAKE 1 TABLET BY MOUTH DAILY 30 tablet 10  . fexofenadine (ALLEGRA) 180 MG tablet Take 1 tablet by mouth daily.    . lipase/protease/amylase (CREON) 36000 UNITS CPEP capsule Take 1-2 capsules by mouth 3 (three) times daily with meals. Take 2 capsules by mouth with each meal and take 1 capsule with each snack    . losartan (COZAAR) 25 MG tablet Take 1 tablet (25 mg total) by mouth daily. 90 tablet 3  . losartan (COZAAR) 50 MG tablet Take 25 mg by mouth daily.    . metoprolol succinate (TOPROL-XL) 25 MG 24 hr tablet Take 12.5 mg by mouth daily.    . tamsulosin (FLOMAX) 0.4 MG CAPS capsule Take 1 capsule by mouth daily.    . Testosterone (FORTESTA) 10 MG/ACT (2%) GEL Place 40 mg onto the skin every evening. 4 pumps     No current facility-administered medications for this visit.     Allergies  Allergen Reactions  . Aspirin Shortness Of Breath  . Nsaids Shortness Of Breath  . Tolmetin Shortness Of Breath  . Isosorbide Dinitrate     Severe migraine headache  . Isosorbide Mononitrate [Isosorbide Dinitrate Er] Other (See Comments)    Severe migraine headache  . Nitroglycerin Other (See Comments)    Pt reported BP dropped very low when he took oral NTG  . Lisinopril Cough    Social History   Socioeconomic History  . Marital status: Married    Spouse name: Not on file  . Number of children: Not on file  . Years of education: Not on file  . Highest education level: Not on file  Occupational History  . Occupation: Retired Nature conservation officer and [employer]  Social Needs  . Financial resource strain: Not on file  . Food insecurity:    Worry: Not on file    Inability: Not on file  . Transportation needs:    Medical: Not on file    Non-medical: Not on file  Tobacco Use  . Smoking status: Never Smoker  . Smokeless  tobacco: Never Used  Substance and Sexual Activity  . Alcohol use: No  . Drug use: No  . Sexual activity: Not on file  Lifestyle  . Physical activity:    Days per week: Not on file    Minutes per session: Not on file  . Stress: Not on file  Relationships  . Social connections:    Talks on phone: Not on file    Gets together: Not on file    Attends religious service: Not on file    Active member of club or organization: Not on file    Attends meetings of clubs or organizations: Not on file  Relationship status: Not on file  . Intimate partner violence:    Fear of current or ex partner: Not on file    Emotionally abused: Not on file    Physically abused: Not on file    Forced sexual activity: Not on file  Other Topics Concern  . Not on file  Social History Narrative   Lives in [place] with wife    Family History  Problem Relation Age of Onset  . Cancer Father     Review of Systems:  As stated in the HPI and otherwise negative.   BP 108/70   Pulse 71   Ht 5\' 9"  (1.753 m)   Wt 209 lb 3.2 oz (94.9 kg)   SpO2 95%   BMI 30.89 kg/m   Physical Examination:  General: Well developed, well nourished, NAD  HEENT: OP clear, mucus membranes moist  SKIN: warm, dry. No rashes. Neuro: No focal deficits  Musculoskeletal: Muscle strength 5/5 all ext  Psychiatric: Mood and affect normal  Neck: No JVD, no carotid bruits, no thyromegaly, no lymphadenopathy.  Lungs:Clear bilaterally, no wheezes, rhonci, crackles Cardiovascular: Regular rate and rhythm. No murmurs, gallops or rubs. Abdomen:Soft. Bowel sounds present. Non-tender.  Extremities: No lower extremity edema. Pulses are 2 + in the bilateral DP/PT.  Cardiac cath 06/09/17:  Ost RPDA lesion, 100 %stenosed.  Mid LAD lesion, 100 %stenosed.  Ost 1st Sept to 1st Sept lesion, 70 %stenosed.  The left ventricular ejection fraction is 45-50% by visual estimate.  There is mild left ventricular systolic dysfunction.  LV end  diastolic pressure is normal.  There is no mitral valve regurgitation.   1. Chronic occlusion of the mid LAD just after a large diagonal branch. The mid and distal LAD fills from right to left collaterals and from left to left collaterals.  2. 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 Mild LV systolic dysfunction with apical hypokinesis.  4. Normal LV filling pressures  Echo March 2018: - Left ventricle: The cavity size was normal. Wall thickness was   increased in a pattern of moderate LVH. Systolic function was   normal. The estimated ejection fraction was in the range of 50%   to 55%. Mild anteroseptal hypokinesis. Doppler parameters are   consistent with abnormal left ventricular relaxation (grade 1   diastolic dysfunction). The E/e&' ratio is between 8-15,   suggesting indeterminate LV filling pressure. - Mitral valve: Calcified annulus. Mildly thickened leaflets .   There was trivial regurgitation. - Left atrium: The atrium was normal in size. - Right atrium: The atrium was mildly dilated. - Inferior vena cava: The vessel was normal in size. The   respirophasic diameter changes were in the normal range (>= 50%),   consistent with normal central venous pressure.  Impressions:  - Compared to a prior study in 02/2016, the LVEF has improved to   50-55% (although there is still mild anteroseptal hypokinesis).  EKG:  EKG is ordered today. The ekg ordered today demonstrates NSR, rate 71 bpm  Recent Labs: No results found for requested labs within last 8760 hours.   Lipid Panel    Component Value Date/Time   CHOL 123 05/24/2017 0737   TRIG 68 05/24/2017 0737   HDL 36 (L) 05/24/2017 0737   CHOLHDL 3.4 05/24/2017 0737   CHOLHDL 5.1 (H) 04/29/2016 1004   VLDL 28 04/29/2016 1004   LDLCALC 73 05/24/2017 0737     Wt Readings from Last 3 Encounters:  01/04/19 209 lb 3.2 oz (94.9  kg)  06/18/18 206 lb (93.4 kg)  02/06/18 205 lb (93 kg)     Other studies Reviewed:  Additional studies/ records that were reviewed today include: . Review of the above records demonstrates:   Assessment and Plan:   1. CAD without angina: He has no chest pain. He is known to have chronic occlusion of the mid LAD and right PDA.  He has mild LV dysfunction due to his chronic LAD occlusion. Last LVEF=45-50% by cath in August 2018. Continue Plavix, statin and beta blocker. No ASA due to intolerance in past.     2. Ischemic cardiomyopathy: Echo March 2018 with LVEF of 50-55%. LVEF=45-50% by LV gram in August 2018. Will continue Losartan and Toprol.    3. Hyperlipidemia: Lipids followed in primary care. Continue statin.    Current medicines are reviewed at length with the patient today.  The patient does not have concerns regarding medicines.  The following changes have been made:  no change  Labs/ tests ordered today include:   Orders Placed This Encounter  Procedures  . EKG 12-Lead    Disposition:   FU with me in 10-14 months  Signed, [name], MD 01/04/2019 4:34 PM    [facility] [place], [place],   [address] Phone: [phone]; Fax: [phone]

## 2019-06-26 ENCOUNTER — Ambulatory Visit (HOSPITAL_COMMUNITY)
Admission: RE | Admit: 2019-06-26 | Discharge: 2019-06-26 | Disposition: A | Discharge status: Home / Self Care | Payer: Medicare Other | Source: Ambulatory Visit | Attending: Family | Admitting: Family

## 2019-06-26 ENCOUNTER — Other Ambulatory Visit (HOSPITAL_COMMUNITY): Payer: Self-pay | Admitting: Internal Medicine

## 2019-06-26 ENCOUNTER — Other Ambulatory Visit: Payer: Self-pay

## 2019-06-26 DIAGNOSIS — I739 Peripheral vascular disease, unspecified: Secondary | ICD-10-CM

## 2019-08-26 ENCOUNTER — Telehealth (HOSPITAL_COMMUNITY): Payer: Self-pay | Admitting: *Deleted

## 2019-08-26 NOTE — Telephone Encounter (Signed)
Spoke with staff informing them that patient does not want to get the ABI's performed again. He had normal ABIs in our office 06/26/2019.

## 2019-10-22 IMAGING — CT CT TEMPORAL BONES W/O CM
1 series · 14 of 30 positions shown, 18 images · non-contrast
Comparison: Maxillofacial CT 10/03/2017

CLINICAL DATA: Nasal polyps.  Chronic pansinusitis.

EXAM:
CT TEMPORAL BONES WITHOUT CONTRAST
TECHNIQUE: Axial and coronal plane CT imaging of the petrous temporal bones was
performed with thin-collimation image reconstruction. No intravenous
contrast was administered. Multiplanar CT image reconstructions were
also generated.

[Series 4: brain (person_name) · axial · 0.50mm/px · z∈[-130,-70]mm · 14 of 35 slices shown, 18 images]
[im 3/35  brain]
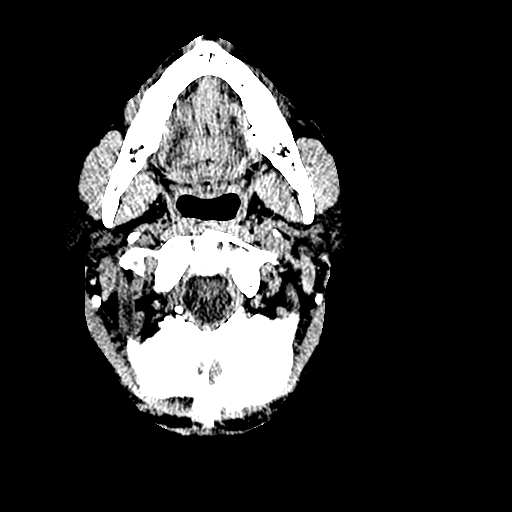
[im 3/35  bone]
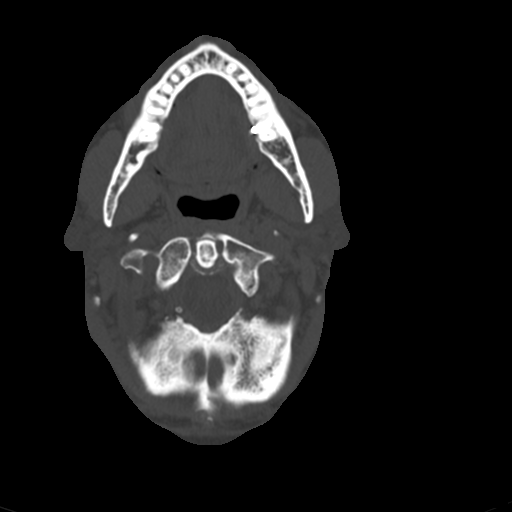
[im 5/35  bone]
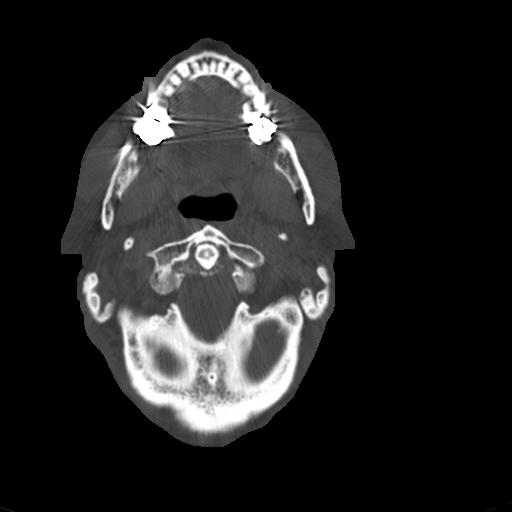
[im 8/35  bone]
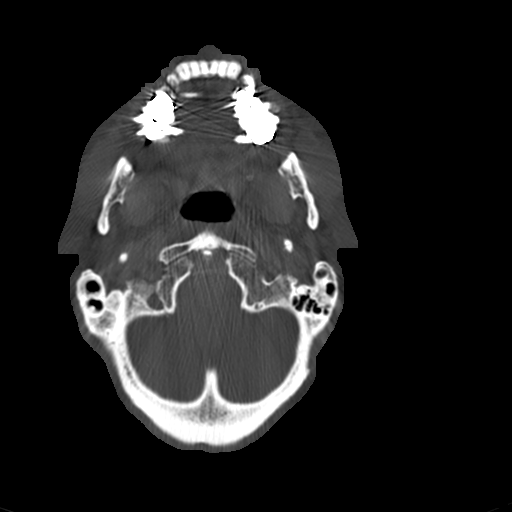
[im 10/35  bone]
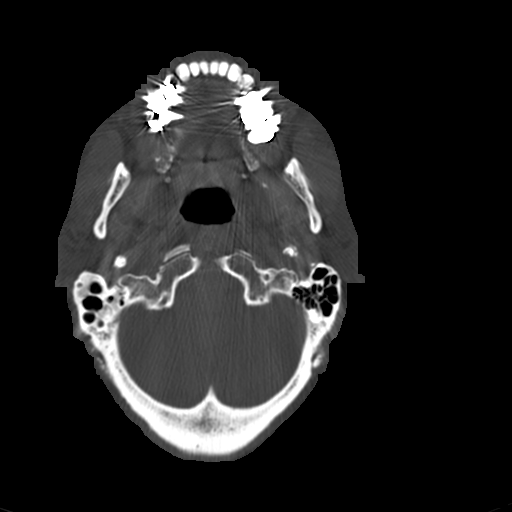
[im 12/35  brain]
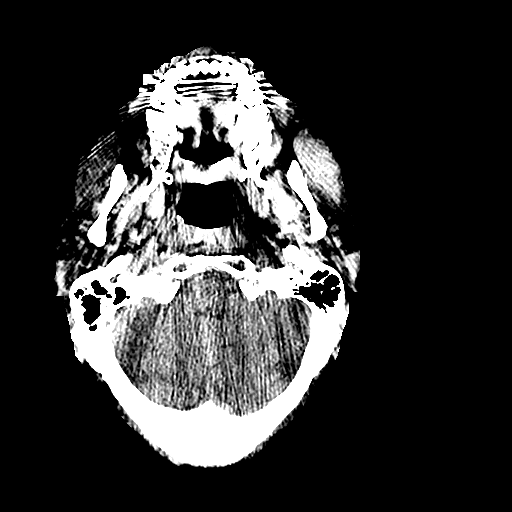
[im 12/35  bone]
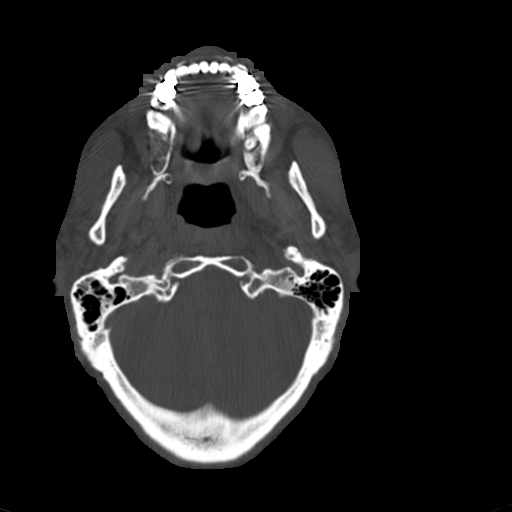
[im 15/35  bone]
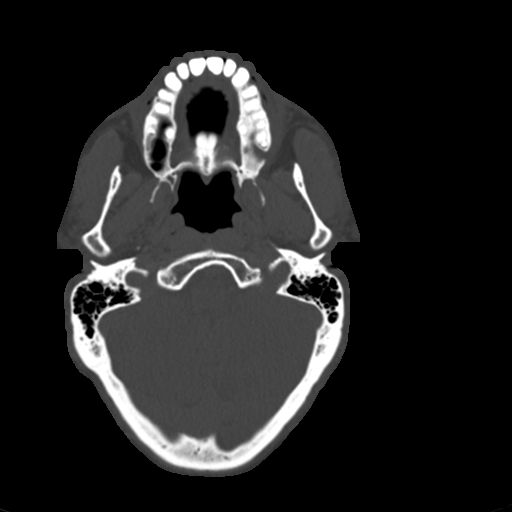
[im 17/35  bone]
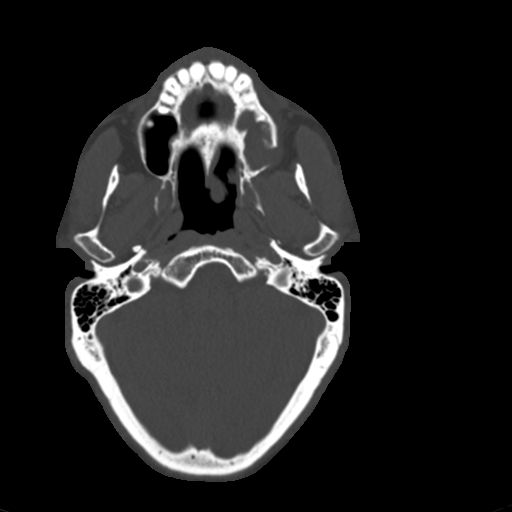
[im 19/35  bone]
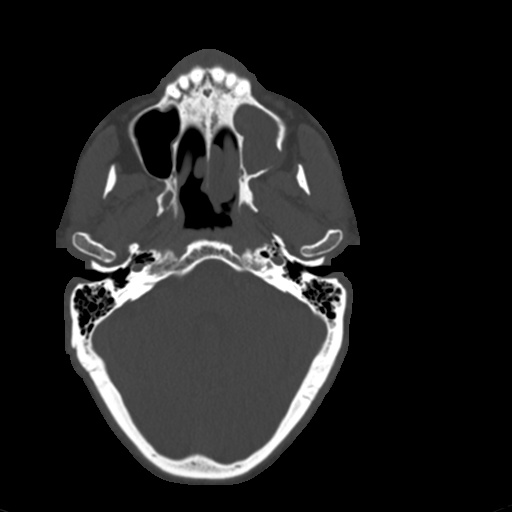
[im 22/35  brain]
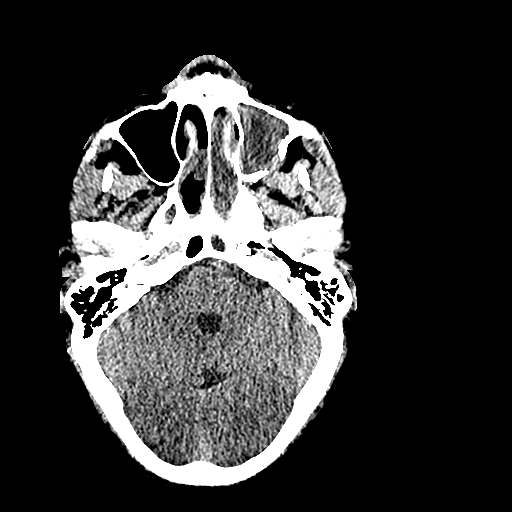
[im 22/35  bone]
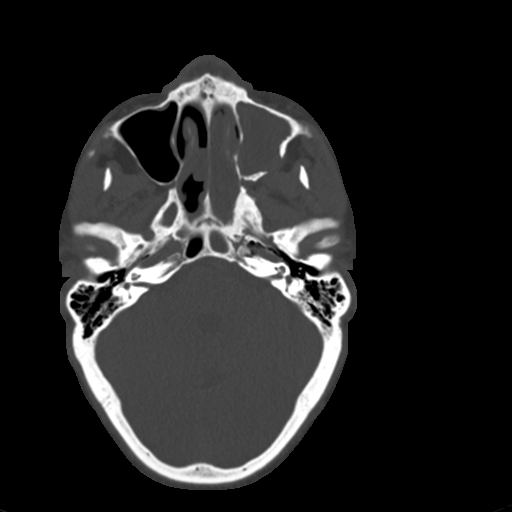
[im 24/35  bone]
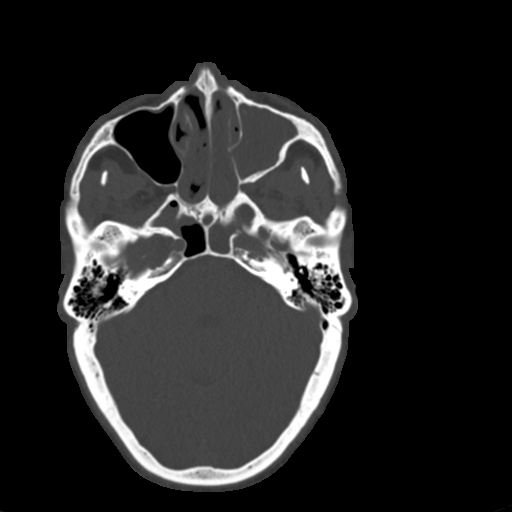
[im 26/35  bone]
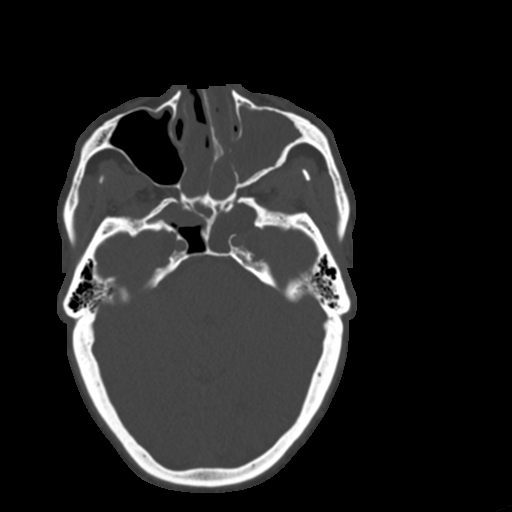
[im 29/35  bone]
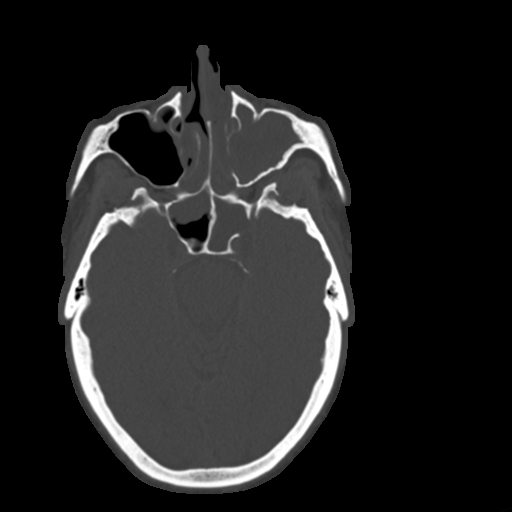
[im 31/35  brain]
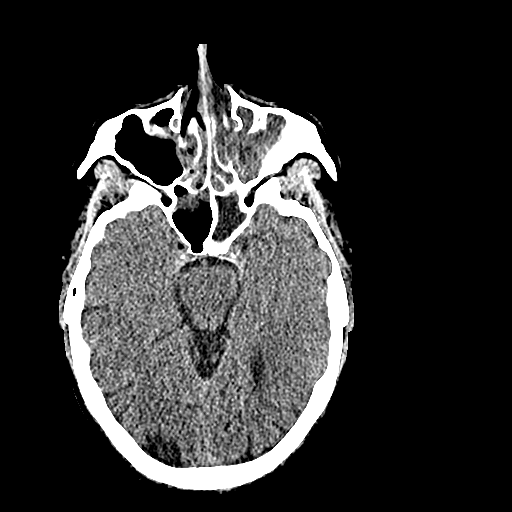
[im 31/35  bone]
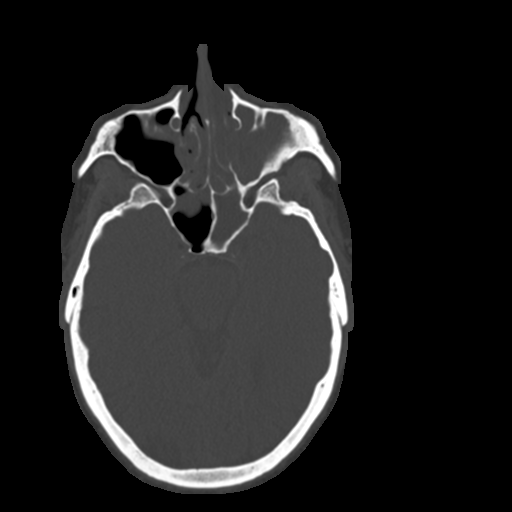
[im 33/35  bone]
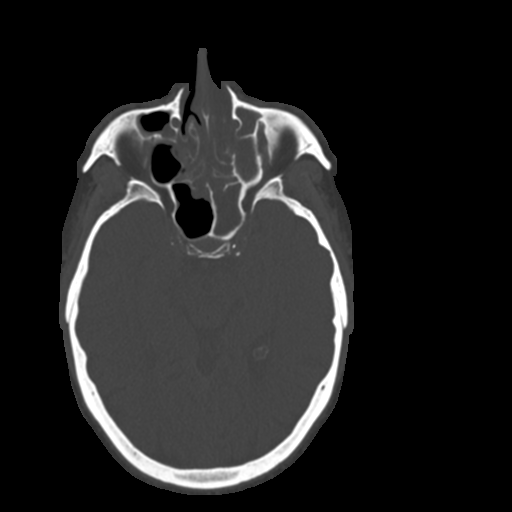

[14 of 30 positions shown; findings below may reference images not displayed]

FINDINGS: Skullbase CT obtained to evaluate the complicated sinusitis.

As noted previously there is complete opacification of the thick
walled and sclerotic left sphenoid sinus. There is a broad bony
dehiscence along the lateral and posterior aspect of the sinus which
involves the carotid canal and uncovers Meckel's cave. Although
partially seen, there is also a second more cranial dehiscence
measuring up to 7 mm in diameter, which involves the inferior aspect
of the optic canal on previous CT. This dehiscence is at the level
of the left cavernous carotid anterior genu. The left sphenoid sinus
low-density, suggesting secretions rather than soft tissue mass.

Complete opacification of the thick walled and sclerotic left
maxillary sinus with a posterior wall dehiscence and bulging of soft
tissue density into the retro antral fat. This sinus appears
circumferentially involved by mucosal disease with central more
low-density secretions.

Bilateral nasal cavity polyps with complete obstruction of the left
nasal cavity. There bony refraction without overt destruction of
bilateral maxillary antrum and ethmoid septae.

The temporal bones show clear well pneumatized mastoid and middle
ear spaces. The ossicles are normally formed and aligned. Normal
density and bone covering of the labyrinthine structures
bilaterally. Bilateral sigmoid sinuses are bone covered.
IMPRESSION: Left more than right sinonasal polyposis and chronic sinusitis.

2 areas of bony dehiscence affecting the left sphenoid sinus. The
opacified sinus has a low-density appearance favoring secretions
over mass.

*11 mm diameter dehiscence involves the carotid canal and overlies
Meckel's cave.
*7 mm diameter dehiscence is at the level of the cavernous carotid
and along the lower margin of the optic canal.

Broad defect in the posterior wall left maxillary sinus with soft
tissue density projecting into the retro antral fat.

Negative temporal bones.

## 2020-01-27 ENCOUNTER — Ambulatory Visit (INDEPENDENT_AMBULATORY_CARE_PROVIDER_SITE_OTHER): Payer: Medicare Other | Admitting: Cardiovascular Disease

## 2020-01-27 ENCOUNTER — Encounter: Payer: Self-pay | Admitting: Cardiovascular Disease

## 2020-01-27 ENCOUNTER — Other Ambulatory Visit: Payer: Self-pay

## 2020-01-27 VITALS — BP 122/70 | HR 80 | Ht 69.0 in | Wt 198.4 lb

## 2020-01-27 DIAGNOSIS — I2510 Atherosclerotic heart disease of native coronary artery without angina pectoris: Secondary | ICD-10-CM

## 2020-01-27 DIAGNOSIS — E785 Hyperlipidemia, unspecified: Secondary | ICD-10-CM | POA: Diagnosis not present

## 2020-01-27 DIAGNOSIS — I255 Ischemic cardiomyopathy: Secondary | ICD-10-CM

## 2020-01-27 LAB — HEPATIC FUNCTION PANEL
ALT: 21 IU/L (ref 0–44)
AST: 28 IU/L (ref 0–40)
Albumin: 4.1 g/dL (ref 3.7–4.7)
Alkaline Phosphatase: 70 IU/L (ref 39–117)
Bilirubin Total: 0.6 mg/dL (ref 0.0–1.2)
Bilirubin, Direct: 0.18 mg/dL (ref 0.00–0.40)
Total Protein: 6.6 g/dL (ref 6.0–8.5)

## 2020-01-27 LAB — LIPID PANEL
Chol/HDL Ratio: 3.6 ratio (ref 0.0–5.0)
Cholesterol, Total: 136 mg/dL (ref 100–199)
HDL: 38 mg/dL — ABNORMAL LOW (ref >39)
LDL Chol Calc (NIH): 80 mg/dL (ref 0–99)
Triglycerides: 95 mg/dL (ref 0–149)
VLDL Cholesterol Cal: 18 mg/dL (ref 5–40)

## 2020-01-27 NOTE — Patient Instructions (Signed)
Medication Instructions:  Your physician recommends that you continue on your current medications as directed. Please refer to the Current Medication list given to you today.  *If you need a refill on your cardiac medications before your next appointment, please call your pharmacy*   Lab Work: Lipid and LFTs today  If you have labs (blood work) drawn today and your tests are completely normal, you will receive your results only by: Marland Kitchen MyChart Message (if you have MyChart) OR . A paper copy in the mail If you have any lab test that is abnormal or we need to change your treatment, we will call you to review the results.   Testing/Procedures: None   Follow-Up: At [facility], you and your health needs are our priority.  As part of our continuing mission to provide you with exceptional heart care, we have created designated Provider Care Teams.  These Care Teams include your primary Cardiologist (physician) and Advanced Practice Providers (APPs -  Physician Assistants and Nurse Practitioners) who all work together to provide you with the care you need, when you need it.  We recommend signing up for the patient portal called "MyChart".  Sign up information is provided on this After Visit Summary.  MyChart is used to connect with patients for Virtual Visits (Telemedicine).  Patients are able to view lab/test results, encounter notes, upcoming appointments, etc.  Non-urgent messages can be sent to your provider as well.   To learn more about what you can do with MyChart, go to [email or web address].    Your next appointment:   12 month(s)  The format for your next appointment:   In Person  Provider:   You may see [name], MD or one of the following Advanced Practice Providers on your designated Care Team:    Melina Copa, PA-C  Ermalinda Barrios, PA-C    Other Instructions

## 2020-01-27 NOTE — Progress Notes (Signed)
Chief Complaint  Patient presents with  . Follow-up    CAD   History of Present Illness: 79 yo male with history of COPD, asthma, hyperlipidemia, CAD, ischemic cardiomyopathy and a pancreatic mass (benign) who is here today for follow up. He was admitted to [facility] 03/07/16 with a NSTEMI and underwent cardiac cath which showed 100% occlusion of the mid LAD and 100% occlusion of the small PDA, both vessels filling from collaterals. His LAD occlusion was felt to be more than 12 hours from presentation so the plan was for medical management. LVEF=40-45% by echo. He was not started on ASA due to intolerance.  Repeat echo March 2018 with LVEF=50-55%. 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 Surgery planned in the fall of 2018 but delayed due to his heart disease. Stress testing was considered mandatory prior to his surgery by anesthesia at [facility] given his extensive CAD. As expected, his nuclear stress test was abnormal. His nuclear stress test showed a reversible defect in the mid anteroseptal/mid inferoseptal and true apical defect, possibly due to ischemia. He was having no chest pain when I saw him in the office 06/11/17 but given his stress test findings, I repeated his cardiac cath on 06/09/17. This showed continued occlusion of the mid LAD and ostial right PDA. No options for PCI. He was having his Whipple procedure performed March 2019 but he developed hypotension during induction of anesthesia requiring pressors and the surgery was not started. They have elected not to perform the Whipple procedure. He has been told his tumor is benign.   He is here today for follow up. The patient denies any exertional chest pain, dyspnea, palpitations, lower extremity edema, orthopnea, PND, dizziness, near syncope or syncope. Rare chest pains after meals when lying down. He helped build a fence all weekend with no chest pain.      Primary Care  Physician: [name], MD   Past Medical History:  Diagnosis Date  . Asthma    asthmatic symptoms, but "I don't have asthma"  . CAD in native artery    a. NSTEMI 02/2016 with occ mLAD & occ small PDA both with collaterals, treated medically. b. Abn nuc 2018 -> stable cath, also 70% 1st septal -> med rx. EF 45-50%.  . Cancer ([place])    skin cancer removed   . COPD (chronic obstructive pulmonary disease) ([place])   . Hyperlipidemia   . Ischemic cardiomyopathy    a. 02/2016: EF 45-50% by cath, 40-45% by echo. b. Cath 2018: EF 45-50%.  . Pancreatic lesion     Past Surgical History:  Procedure Laterality Date  . CARDIAC CATHETERIZATION N/A 03/07/2016   Procedure: Left Heart Cath and Coronary Angiography;  Surgeon: [name], MD;  Location: [place] [facility];  Service: Cardiovascular;  Laterality: N/A;  . knee surgery     left knee reconstruction  . LEFT HEART CATH AND CORONARY ANGIOGRAPHY N/A 06/09/2017   Procedure: LEFT HEART CATH AND CORONARY ANGIOGRAPHY;  Surgeon: [name], MD;  Location: [place] [facility];  Service: Cardiovascular;  Laterality: N/A;  . SEPTOPLASTY N/A 11/29/2017   Procedure: SEPTOPLASTY;  Surgeon: [name], MD;  Location: [place];  Service: ENT;  Laterality: N/A;  . SINUS ENDO WITH FUSION N/A 11/29/2017   Procedure: ENDOSCOPIC SINUS SURGERY WITH FUSION;  Surgeon: [name], MD;  Location: [place];  Service: ENT;  Laterality: N/A;  .  skin cancer removed     back of left neck    Current Outpatient Medications  Medication Sig Dispense Refill  . acetaminophen (TYLENOL) 500 MG tablet Take 1,000 mg by mouth daily as needed for moderate pain or headache.    . ADVAIR DISKUS 250-50 MCG/DOSE AEPB Inhale 1 puff into the lungs every 12 (twelve) hours.    Marland Kitchen albuterol (PROVENTIL HFA;VENTOLIN HFA) 108 (90 Base) MCG/ACT inhaler Inhale 1-2 puffs into the lungs every 6 (six) hours as needed for wheezing or shortness of breath.    Marland Kitchen albuterol (PROVENTIL) (2.5  MG/3ML) 0.083% nebulizer solution Take 2.5 mg by nebulization every 6 (six) hours as needed for wheezing or shortness of breath.    Marland Kitchen atorvastatin (LIPITOR) 80 MG tablet Take 1 tablet (80 mg total) by mouth daily at 6 PM. 30 tablet 11  . clopidogrel (PLAVIX) 75 MG tablet TAKE 1 TABLET BY MOUTH DAILY 30 tablet 10  . fexofenadine (ALLEGRA) 180 MG tablet Take 1 tablet by mouth daily.    . lipase/protease/amylase (CREON) 36000 UNITS CPEP capsule Take 1-2 capsules by mouth 3 (three) times daily with meals. Take 2 capsules by mouth with each meal and take 1 capsule with each snack    . losartan (COZAAR) 50 MG tablet Take 25 mg by mouth daily.    . metoprolol succinate (TOPROL-XL) 25 MG 24 hr tablet Take 12.5 mg by mouth daily.    . tamsulosin (FLOMAX) 0.4 MG CAPS capsule Take 1 capsule by mouth daily.    . Testosterone (FORTESTA) 10 MG/ACT (2%) GEL Place 40 mg onto the skin every evening. 4 pumps    . gabapentin (NEURONTIN) 100 MG capsule Take 100 mg by mouth daily.     No current facility-administered medications for this visit.    Allergies  Allergen Reactions  . Aspirin Shortness Of Breath  . Nsaids Shortness Of Breath  . Tolmetin Shortness Of Breath  . Isosorbide Dinitrate     Severe migraine headache  . Isosorbide Mononitrate [Isosorbide Dinitrate Er] Other (See Comments)    Severe migraine headache  . Nitroglycerin Other (See Comments)    Pt reported BP dropped very low when he took oral NTG  . Lisinopril Cough    Social History   Socioeconomic History  . Marital status: Married    Spouse name: Not on file  . Number of children: Not on file  . Years of education: Not on file  . Highest education level: Not on file  Occupational History  . Occupation: Retired Nature conservation officer and [employer]  Tobacco Use  . Smoking status: Never Smoker  . Smokeless tobacco: Never Used  Substance and Sexual Activity  . Alcohol use: No  . Drug use: No  . Sexual activity: Not on file  Other Topics Concern   . Not on file  Social History Narrative   Lives in [place] with wife   Social Determinants of Health   Financial Resource Strain:   . Difficulty of Paying Living Expenses:   Food Insecurity:   . Worried About Charity fundraiser in the Last Year:   . Arboriculturist in the Last Year:   Transportation Needs:   . Film/video editor (Medical):   Marland Kitchen Lack of Transportation (Non-Medical):   Physical Activity:   . Days of Exercise per Week:   . Minutes of Exercise per Session:   Stress:   . Feeling of Stress :   Social Connections:   . Frequency of Communication  with Friends and Family:   . Frequency of Social Gatherings with Friends and Family:   . Attends Religious Services:   . Active Member of Clubs or Organizations:   . Attends Archivist Meetings:   Marland Kitchen Marital Status:   Intimate Partner Violence:   . Fear of Current or Ex-Partner:   . Emotionally Abused:   Marland Kitchen Physically Abused:   . Sexually Abused:     Family History  Problem Relation Age of Onset  . Cancer Father     Review of Systems:  As stated in the HPI and otherwise negative.   BP 122/70   Pulse 80   Ht 5\' 9"  (1.753 m)   Wt 198 lb 6.4 oz (90 kg)   SpO2 96%   BMI 29.30 kg/m   Physical Examination:  General: Well developed, well nourished, NAD  HEENT: OP clear, mucus membranes moist  SKIN: warm, dry. No rashes. Neuro: No focal deficits  Musculoskeletal: Muscle strength 5/5 all ext  Psychiatric: Mood and affect normal  Neck: No JVD, no carotid bruits, no thyromegaly, no lymphadenopathy.  Lungs:Clear bilaterally, no wheezes, rhonci, crackles Cardiovascular: Regular rate and rhythm. No murmurs, gallops or rubs. Abdomen:Soft. Bowel sounds present. Non-tender.  Extremities: No lower extremity edema. Pulses are 2 + in the bilateral DP/PT.  Cardiac cath 06/09/17:  Ost RPDA lesion, 100 %stenosed.  Mid LAD lesion, 100 %stenosed.  Ost 1st Sept to 1st Sept lesion, 70 %stenosed.  The left  ventricular ejection fraction is 45-50% by visual estimate.  There is mild left ventricular systolic dysfunction.  LV end diastolic pressure is normal.  There is no mitral valve regurgitation.   1. Chronic occlusion of the mid LAD just after a large diagonal branch. The mid and distal LAD fills from right to left collaterals and from left to left collaterals.  2. 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 Mild LV systolic dysfunction with apical hypokinesis.  4. Normal LV filling pressures  Echo March 2018: - Left ventricle: The cavity size was normal. Wall thickness was   increased in a pattern of moderate LVH. Systolic function was   normal. The estimated ejection fraction was in the range of 50%   to 55%. Mild anteroseptal hypokinesis. Doppler parameters are   consistent with abnormal left ventricular relaxation (grade 1   diastolic dysfunction). The E/e&' ratio is between 8-15,   suggesting indeterminate LV filling pressure. - Mitral valve: Calcified annulus. Mildly thickened leaflets .   There was trivial regurgitation. - Left atrium: The atrium was normal in size. - Right atrium: The atrium was mildly dilated. - Inferior vena cava: The vessel was normal in size. The   respirophasic diameter changes were in the normal range (>= 50%),   consistent with normal central venous pressure.  Impressions:  - Compared to a prior study in 02/2016, the LVEF has improved to   50-55% (although there is still mild anteroseptal hypokinesis).  EKG:  EKG is ordered today. The ekg ordered today demonstrates sinus, PACs.   Recent Labs: No results found for requested labs within last 8760 hours.   Lipid Panel    Component Value Date/Time   CHOL 123 05/24/2017 0737   TRIG 68 05/24/2017 0737   HDL 36 (L) 05/24/2017 0737   CHOLHDL 3.4 05/24/2017 0737   CHOLHDL 5.1 (H) 04/29/2016 1004   VLDL 28 04/29/2016 1004   LDLCALC 73 05/24/2017 0737     Wt Readings from Last 3 Encounters:   01/27/20  198 lb 6.4 oz (90 kg)  01/04/19 209 lb 3.2 oz (94.9 kg)  06/18/18 206 lb (93.4 kg)     Other studies Reviewed: Additional studies/ records that were reviewed today include: . Review of the above records demonstrates:   Assessment and Plan:   1. CAD without angina: He is known to have chronic occlusion of the mid LAD and right PDA.  He has mild LV dysfunction due to his chronic LAD occlusion. Last LVEF=45-50% by cath in August 2018. He has no chest pain. Will continue Plavix, beta blocker and statin. No ASA due to intolerance in past.     2. Ischemic cardiomyopathy: Echo March 2018 with LVEF of 50-55%. LVEF=45-50% by LV gram in August 2018. Continue Toprol and Losartan.   3. Hyperlipidemia: Lipids followed in primary care but no recent labs. Will check lipids and LFTs now. Continue statin.   Current medicines are reviewed at length with the patient today.  The patient does not have concerns regarding medicines.  The following changes have been made:  no change  Labs/ tests ordered today include:   Orders Placed This Encounter  Procedures  . Lipid panel  . Hepatic function panel  . EKG 12-Lead    Disposition:   FU with me in 12  months  Signed, [name], MD 01/27/2020 11:45 AM    [address], [place], [place]  [address] Phone: [phone]; Fax: [phone]

## 2020-09-28 ENCOUNTER — Telehealth: Payer: Self-pay | Admitting: Cardiovascular Disease

## 2020-09-28 NOTE — Telephone Encounter (Signed)
STAT if patient feels like he/she is going to faint   1) Are you dizzy now? Patient's wife states she is not with the patient.  2) Do you feel faint or have you passed out? No   3) Do you have any other symptoms? No   4) Have you checked your HR and BP (record if available)? No

## 2020-09-28 NOTE — Telephone Encounter (Signed)
Getting dizzy when standing, feels like he's leaning to right side.  Started over the weekend.  Eating and drinking normally.  feels completely normal otherwise.   Does not know blood pressure.  When wife comes home for lunch they will check it.   I asked him to get a reading with him sitting and then one right after w him standing.  Adv to drink a couple extra cups of fluids also.  Pt will call back with BPs.

## 2020-10-01 NOTE — Telephone Encounter (Signed)
Called and spoke with patient.  He has increased fluids some.  The symptoms have improved  He is seeing his PCP on Monday. BP was okay "within limits".  Cant remember the reading.  He will call back next week with update after seeing PCP.

## 2020-12-16 ENCOUNTER — Telehealth: Payer: Self-pay | Admitting: Cardiovascular Disease

## 2020-12-16 NOTE — Telephone Encounter (Signed)
Spoke with patient's wife and scheduled pt.

## 2020-12-16 NOTE — Telephone Encounter (Signed)
    Pt's wife scheduling pt's recall with Dr. Angelena Form, advised no available appt and July calendar is not open yet. Offered APP, she said pt been seeing Dr. Angelena Form and only wants to see him, she asked to send message to his nurse

## 2021-01-21 ENCOUNTER — Encounter: Payer: Self-pay | Admitting: Cardiovascular Disease

## 2021-01-21 ENCOUNTER — Encounter (INDEPENDENT_AMBULATORY_CARE_PROVIDER_SITE_OTHER): Payer: Self-pay

## 2021-01-21 ENCOUNTER — Other Ambulatory Visit: Payer: Self-pay

## 2021-01-21 ENCOUNTER — Ambulatory Visit (INDEPENDENT_AMBULATORY_CARE_PROVIDER_SITE_OTHER): Payer: Medicare Other | Admitting: Cardiovascular Disease

## 2021-01-21 VITALS — BP 118/60 | HR 72 | Ht 69.0 in | Wt 202.8 lb

## 2021-01-21 DIAGNOSIS — E785 Hyperlipidemia, unspecified: Secondary | ICD-10-CM

## 2021-01-21 DIAGNOSIS — I255 Ischemic cardiomyopathy: Secondary | ICD-10-CM | POA: Diagnosis not present

## 2021-01-21 DIAGNOSIS — I2510 Atherosclerotic heart disease of native coronary artery without angina pectoris: Secondary | ICD-10-CM | POA: Diagnosis not present

## 2021-01-21 NOTE — Patient Instructions (Addendum)
Medication Instructions:  No changes *If you need a refill on your cardiac medications before your next appointment, please call your pharmacy*   Lab Work: Lipids/Liver - Please return April 15  If you have labs (blood work) drawn today and your tests are completely normal, you will receive your results only by: Marland Kitchen MyChart Message (if you have MyChart) OR . A paper copy in the mail If you have any lab test that is abnormal or we need to change your treatment, we will call you to review the results.   Testing/Procedures: none   Follow-Up: At [facility], you and your health needs are our priority.  As part of our continuing mission to provide you with exceptional heart care, we have created designated Provider Care Teams.  These Care Teams include your primary Cardiologist (physician) and Advanced Practice Providers (APPs -  Physician Assistants and Nurse Practitioners) who all work together to provide you with the care you need, when you need it.  We recommend signing up for the patient portal called "MyChart".  Sign up information is provided on this After Visit Summary.  MyChart is used to connect with patients for Virtual Visits (Telemedicine).  Patients are able to view lab/test results, encounter notes, upcoming appointments, etc.  Non-urgent messages can be sent to your provider as well.   To learn more about what you can do with MyChart, go to [email or web address].    Your next appointment:   12 month(s)  The format for your next appointment:   In Person  Provider:   You may see [name], MD or one of the following Advanced Practice Providers on your designated Care Team:    Melina Copa, PA-C  Ermalinda Barrios, PA-C   Other Instructions

## 2021-01-21 NOTE — Progress Notes (Signed)
Chief Complaint  Patient presents with  . Follow-up    CAD   History of Present Illness: 80 yo male with history of COPD, asthma, hyperlipidemia, CAD, ischemic cardiomyopathy, Parkinson's disease and a pancreatic mass (benign) who is here today for follow up. He was admitted to [facility] 03/07/16 with a NSTEMI and underwent cardiac cath which showed 100% occlusion of the mid LAD and 100% occlusion of the small PDA, both vessels filling from collaterals. His LAD occlusion was felt to be more than 12 hours from presentation so the plan was for medical management. LVEF=40-45% by echo. He was not started on ASA due to intolerance.  Repeat echo March 2018 with LVEF=50-55%. 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 Surgery planned in the fall of 2018 but delayed due to his heart disease. Stress testing was considered mandatory prior to his surgery by anesthesia at [facility] given his extensive CAD. As expected, his nuclear stress test was abnormal. His nuclear stress test showed a reversible defect in the mid anteroseptal/mid inferoseptal and true apical defect, possibly due to ischemia. He was having no chest pain when I saw him in the office 06/11/17 but given his stress test findings, I repeated his cardiac cath on 06/09/17. This showed continued occlusion of the mid LAD and ostial right PDA. No options for PCI. He was having his Whipple procedure performed March 2019 but he developed hypotension during induction of anesthesia requiring pressors and the surgery was not started. They have elected not to perform the Whipple procedure. He has been told his tumor is benign. He has had no follow up for the mass by his choice. He was diagnosed with Parkinson's disease in February 2022.   He is here today for follow up. The patient denies any chest pain, dyspnea, palpitations, lower extremity edema, orthopnea, PND, dizziness, near syncope or syncope.    Primary Care Physician: [name], MD  Past Medical History:  Diagnosis Date  . Asthma    asthmatic symptoms, but "I don't have asthma"  . CAD in native artery    a. NSTEMI 02/2016 with occ mLAD & occ small PDA both with collaterals, treated medically. b. Abn nuc 2018 -> stable cath, also 70% 1st septal -> med rx. EF 45-50%.  . Cancer ([place])    skin cancer removed   . COPD (chronic obstructive pulmonary disease) ([place])   . Hyperlipidemia   . Ischemic cardiomyopathy    a. 02/2016: EF 45-50% by cath, 40-45% by echo. b. Cath 2018: EF 45-50%.  . Pancreatic lesion     Past Surgical History:  Procedure Laterality Date  . CARDIAC CATHETERIZATION N/A 03/07/2016   Procedure: Left Heart Cath and Coronary Angiography;  Surgeon: [name], MD;  Location: [place] [facility];  Service: Cardiovascular;  Laterality: N/A;  . knee surgery     left knee reconstruction  . LEFT HEART CATH AND CORONARY ANGIOGRAPHY N/A 06/09/2017   Procedure: LEFT HEART CATH AND CORONARY ANGIOGRAPHY;  Surgeon: [name], MD;  Location: [place] [facility];  Service: Cardiovascular;  Laterality: N/A;  . SEPTOPLASTY N/A 11/29/2017   Procedure: SEPTOPLASTY;  Surgeon: [name], MD;  Location: [place];  Service: ENT;  Laterality: N/A;  . SINUS ENDO WITH FUSION N/A 11/29/2017   Procedure: ENDOSCOPIC SINUS SURGERY WITH FUSION;  Surgeon: [name], MD;  Location: [place];  Service: ENT;  Laterality: N/A;  . skin  cancer removed     back of left neck    Current Outpatient Medications  Medication Sig Dispense Refill  . acetaminophen (TYLENOL) 500 MG tablet Take 1,000 mg by mouth daily as needed for moderate pain or headache.    . ADVAIR DISKUS 250-50 MCG/DOSE AEPB Inhale 1 puff into the lungs every 12 (twelve) hours.    Marland Kitchen albuterol (PROVENTIL HFA;VENTOLIN HFA) 108 (90 Base) MCG/ACT inhaler Inhale 1-2 puffs into the lungs every 6 (six) hours as needed for wheezing or shortness of breath.    Marland Kitchen albuterol  (PROVENTIL) (2.5 MG/3ML) 0.083% nebulizer solution Take 2.5 mg by nebulization every 6 (six) hours as needed for wheezing or shortness of breath.    Marland Kitchen atorvastatin (LIPITOR) 80 MG tablet Take 1 tablet (80 mg total) by mouth daily at 6 PM. 30 tablet 11  . carbidopa-levodopa (SINEMET IR) 25-100 MG tablet Take 1 tablet by mouth in the morning, at noon, and at bedtime.    . clopidogrel (PLAVIX) 75 MG tablet TAKE 1 TABLET BY MOUTH DAILY 30 tablet 10  . fexofenadine (ALLEGRA) 180 MG tablet Take 1 tablet by mouth daily.    Marland Kitchen gabapentin (NEURONTIN) 100 MG capsule Take 100 mg by mouth daily.    . lipase/protease/amylase (CREON) 36000 UNITS CPEP capsule Take 1-2 capsules by mouth 3 (three) times daily with meals. Take 2 capsules by mouth with each meal and take 1 capsule with each snack    . losartan (COZAAR) 50 MG tablet Take 25 mg by mouth daily.    . metoprolol succinate (TOPROL-XL) 25 MG 24 hr tablet Take 12.5 mg by mouth daily.    . tamsulosin (FLOMAX) 0.4 MG CAPS capsule Take 1 capsule by mouth daily.    . Testosterone 10 MG/ACT (2%) GEL Place 40 mg onto the skin every evening. 4 pumps     No current facility-administered medications for this visit.    Allergies  Allergen Reactions  . Aspirin Shortness Of Breath  . Nsaids Shortness Of Breath  . Tolmetin Shortness Of Breath  . Isosorbide Dinitrate     Severe migraine headache  . Isosorbide Mononitrate [Isosorbide Dinitrate Er] Other (See Comments)    Severe migraine headache  . Nitroglycerin Other (See Comments)    Pt reported BP dropped very low when he took oral NTG  . Lisinopril Cough    Social History   Socioeconomic History  . Marital status: Married    Spouse name: Not on file  . Number of children: Not on file  . Years of education: Not on file  . Highest education level: Not on file  Occupational History  . Occupation: Retired Nature conservation officer and [employer]  Tobacco Use  . Smoking status: Never Smoker  . Smokeless tobacco: Never Used   Vaping Use  . Vaping Use: Never used  Substance and Sexual Activity  . Alcohol use: No  . Drug use: No  . Sexual activity: Not on file  Other Topics Concern  . Not on file  Social History Narrative   Lives in [place] with wife   Social Determinants of Health   Financial Resource Strain: Not on file  Food Insecurity: Not on file  Transportation Needs: Not on file  Physical Activity: Not on file  Stress: Not on file  Social Connections: Not on file  Intimate Partner Violence: Not on file    Family History  Problem Relation Age of Onset  . Cancer Father     Review of Systems:  As stated  in the HPI and otherwise negative.   BP 118/60   Pulse 72   Ht 5\' 9"  (1.753 m)   Wt 202 lb 12.8 oz (92 kg)   SpO2 96%   BMI 29.95 kg/m   Physical Examination:  General: Well developed, well nourished, NAD  HEENT: OP clear, mucus membranes moist  SKIN: warm, dry. No rashes. Neuro: No focal deficits  Musculoskeletal: Muscle strength 5/5 all ext  Psychiatric: Mood and affect normal  Neck: No JVD, no carotid bruits, no thyromegaly, no lymphadenopathy.  Lungs:Clear bilaterally, no wheezes, rhonci, crackles Cardiovascular: Regular rate and rhythm. No murmurs, gallops or rubs. Abdomen:Soft. Bowel sounds present. Non-tender.  Extremities: No lower extremity edema. Pulses are 2 + in the bilateral DP/PT.  Cardiac cath 06/09/17:  Ost RPDA lesion, 100 %stenosed.  Mid LAD lesion, 100 %stenosed.  Ost 1st Sept to 1st Sept lesion, 70 %stenosed.  The left ventricular ejection fraction is 45-50% by visual estimate.  There is mild left ventricular systolic dysfunction.  LV end diastolic pressure is normal.  There is no mitral valve regurgitation.   1. Chronic occlusion of the mid LAD just after a large diagonal branch. The mid and distal LAD fills from right to left collaterals and from left to left collaterals.  2. 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 Mild LV systolic dysfunction with  apical hypokinesis.  4. Normal LV filling pressures  Echo March 2018: - Left ventricle: The cavity size was normal. Wall thickness was   increased in a pattern of moderate LVH. Systolic function was   normal. The estimated ejection fraction was in the range of 50%   to 55%. Mild anteroseptal hypokinesis. Doppler parameters are   consistent with abnormal left ventricular relaxation (grade 1   diastolic dysfunction). The E/e&' ratio is between 8-15,   suggesting indeterminate LV filling pressure. - Mitral valve: Calcified annulus. Mildly thickened leaflets .   There was trivial regurgitation. - Left atrium: The atrium was normal in size. - Right atrium: The atrium was mildly dilated. - Inferior vena cava: The vessel was normal in size. The   respirophasic diameter changes were in the normal range (>= 50%),   consistent with normal central venous pressure.  Impressions:  - Compared to a prior study in 02/2016, the LVEF has improved to   50-55% (although there is still mild anteroseptal hypokinesis).  EKG:  EKG is not ordered today. The ekg ordered today demonstrates   Recent Labs: 01/27/2020: ALT 21   Lipid Panel    Component Value Date/Time   CHOL 136 01/27/2020 1148   TRIG 95 01/27/2020 1148   HDL 38 (L) 01/27/2020 1148   CHOLHDL 3.6 01/27/2020 1148   CHOLHDL 5.1 (H) 04/29/2016 1004   VLDL 28 04/29/2016 1004   LDLCALC 80 01/27/2020 1148     Wt Readings from Last 3 Encounters:  01/21/21 202 lb 12.8 oz (92 kg)  01/27/20 198 lb 6.4 oz (90 kg)  01/04/19 209 lb 3.2 oz (94.9 kg)     Other studies Reviewed: Additional studies/ records that were reviewed today include: . Review of the above records demonstrates:   Assessment and Plan:   1. CAD without angina: He is known to have chronic occlusion of the mid LAD and right PDA.  He has mild LV dysfunction due to his chronic LAD occlusion. Last LVEF=45-50% by cath in August 2018. No chest pain. Continue Plavix, statin and  beta blocker. No ASA due to intolerance in past.  2. Ischemic cardiomyopathy: Echo March 2018 with LVEF of 50-55%. LVEF=45-50% by LV gram in August 2018. Will continue Losartan and Toprol  3. Hyperlipidemia: Lipids followed in primary. LDL April 2021 near goal. Continue statin. Repeat lipids and LFTs now.    Current medicines are reviewed at length with the patient today.  The patient does not have concerns regarding medicines.  The following changes have been made:  no change  Labs/ tests ordered today include:   Orders Placed This Encounter  Procedures  . Lipid panel  . Hepatic function panel    Disposition:   FU with me in 12  months  Signed, [name], MD 01/21/2021 11:57 AM    [facility] [place], [place], [place]  [address] Phone: [phone]; Fax: [phone]

## 2021-02-05 ENCOUNTER — Other Ambulatory Visit: Payer: Medicare Other | Admitting: *Deleted

## 2021-02-05 ENCOUNTER — Other Ambulatory Visit: Payer: Self-pay

## 2021-02-05 DIAGNOSIS — E785 Hyperlipidemia, unspecified: Secondary | ICD-10-CM

## 2021-02-05 DIAGNOSIS — I2510 Atherosclerotic heart disease of native coronary artery without angina pectoris: Secondary | ICD-10-CM

## 2021-02-05 DIAGNOSIS — I255 Ischemic cardiomyopathy: Secondary | ICD-10-CM

## 2021-02-05 LAB — LIPID PANEL
Chol/HDL Ratio: 3.6 ratio (ref 0.0–5.0)
Cholesterol, Total: 139 mg/dL (ref 100–199)
HDL: 39 mg/dL — ABNORMAL LOW (ref >39)
LDL Chol Calc (NIH): 86 mg/dL (ref 0–99)
Triglycerides: 66 mg/dL (ref 0–149)
VLDL Cholesterol Cal: 14 mg/dL (ref 5–40)

## 2021-02-05 LAB — HEPATIC FUNCTION PANEL
ALT: 10 IU/L (ref 0–44)
AST: 29 IU/L (ref 0–40)
Albumin: 4.2 g/dL (ref 3.7–4.7)
Alkaline Phosphatase: 61 IU/L (ref 44–121)
Bilirubin Total: 1 mg/dL (ref 0.0–1.2)
Bilirubin, Direct: 0.22 mg/dL (ref 0.00–0.40)
Total Protein: 7 g/dL (ref 6.0–8.5)

## 2021-03-25 ENCOUNTER — Other Ambulatory Visit: Payer: Self-pay | Admitting: Internal Medicine

## 2021-03-26 LAB — COMPLETE METABOLIC PANEL WITH GFR
AG Ratio: 1.5 (calc) (ref 1.0–2.5)
ALT: 28 U/L (ref 9–46)
AST: 22 U/L (ref 10–35)
Albumin: 4.1 g/dL (ref 3.6–5.1)
Alkaline phosphatase (APISO): 49 U/L (ref 35–144)
BUN: 23 mg/dL (ref 7–25)
CO2: 23 mmol/L (ref 20–32)
Calcium: 9.1 mg/dL (ref 8.6–10.3)
Chloride: 107 mmol/L (ref 98–110)
Creat: 0.97 mg/dL (ref 0.70–1.18)
GFR, Est African American: 86 mL/min/{1.73_m2} (ref >=60)
GFR, Est Non African American: 74 mL/min/{1.73_m2} (ref >=60)
Globulin: 2.7 g/dL (calc) (ref 1.9–3.7)
Glucose, Bld: 130 mg/dL — ABNORMAL HIGH (ref 65–99)
Potassium: 4.2 mmol/L (ref 3.5–5.3)
Sodium: 141 mmol/L (ref 135–146)
Total Bilirubin: 0.8 mg/dL (ref 0.2–1.2)
Total Protein: 6.8 g/dL (ref 6.1–8.1)

## 2021-03-26 LAB — CBC
HCT: 43.1 % (ref 38.5–50.0)
Hemoglobin: 13.9 g/dL (ref 13.2–17.1)
MCH: 28.7 pg (ref 27.0–33.0)
MCHC: 32.3 g/dL (ref 32.0–36.0)
MCV: 88.9 fL (ref 80.0–100.0)
MPV: 11.5 fL (ref 7.5–12.5)
Platelets: 220 10*3/uL (ref 140–400)
RBC: 4.85 10*6/uL (ref 4.20–5.80)
RDW: 13.2 % (ref 11.0–15.0)
WBC: 5.7 10*3/uL (ref 3.8–10.8)

## 2021-03-26 LAB — LIPID PANEL
Cholesterol: 138 mg/dL (ref <200)
HDL: 38 mg/dL — ABNORMAL LOW (ref >=40)
LDL Cholesterol (Calc): 84 mg/dL (calc)
Non-HDL Cholesterol (Calc): 100 mg/dL (calc) (ref <130)
Total CHOL/HDL Ratio: 3.6 (calc) (ref <5.0)
Triglycerides: 71 mg/dL (ref <150)

## 2021-03-26 LAB — TSH: TSH: 2.59 mIU/L (ref 0.40–4.50)

## 2022-03-14 ENCOUNTER — Encounter: Payer: Self-pay | Admitting: Cardiovascular Disease

## 2022-03-14 ENCOUNTER — Ambulatory Visit (INDEPENDENT_AMBULATORY_CARE_PROVIDER_SITE_OTHER): Payer: Medicare Other | Admitting: Cardiovascular Disease

## 2022-03-14 VITALS — BP 138/78 | HR 63 | Ht 69.0 in | Wt 203.4 lb

## 2022-03-14 DIAGNOSIS — E785 Hyperlipidemia, unspecified: Secondary | ICD-10-CM

## 2022-03-14 DIAGNOSIS — I255 Ischemic cardiomyopathy: Secondary | ICD-10-CM | POA: Diagnosis not present

## 2022-03-14 DIAGNOSIS — I2510 Atherosclerotic heart disease of native coronary artery without angina pectoris: Secondary | ICD-10-CM

## 2022-03-14 MED ORDER — ATORVASTATIN CALCIUM 80 MG PO TABS: 80.0000 mg | ORAL_TABLET | Freq: Every day | ORAL | 3 refills | Status: DC

## 2022-03-14 NOTE — Progress Notes (Signed)
Chief Complaint  Patient presents with   Follow-up    CAD   History of Present Illness: 81 yo male with history of COPD, asthma, hyperlipidemia, CAD, ischemic cardiomyopathy, Parkinson's disease and a pancreatic mass (benign) who is here today for follow up. He was admitted to [facility] 03/07/16 with a NSTEMI and underwent cardiac cath which showed 100% occlusion of the mid LAD and 100% occlusion of the small PDA, both vessels filling from collaterals. His LAD occlusion was felt to be more than 12 hours from presentation so the plan was for medical management. LVEF=40-45% by echo. He was not started on ASA due to intolerance.  Repeat echo March 2018 with LVEF=50-55%. 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 Surgery planned in the fall of 2018 but delayed due to his heart disease. Stress testing was considered mandatory prior to his surgery by anesthesia at [facility] given his extensive CAD. As expected, his nuclear stress test was abnormal. His nuclear stress test showed a reversible defect in the mid anteroseptal/mid inferoseptal and true apical defect, possibly due to ischemia. He was having no chest pain when I saw him in the office 06/11/17 but given his stress test findings, I repeated his cardiac cath on 06/09/17. This showed continued occlusion of the mid LAD and ostial right PDA. No options for PCI. He was having his Whipple procedure performed March 2019 but he developed hypotension during induction of anesthesia requiring pressors and the surgery was not started. They have elected not to perform the Whipple procedure. He has been told his tumor is benign. He has had no follow up for the mass by his choice. He was diagnosed with Parkinson's disease in February 2022.   He is here today for follow up. The patient denies any chest pain, dyspnea, palpitations, lower extremity edema, orthopnea, PND, dizziness, near syncope or syncope.  Losartan stopped due to hypotension. He has not been on his Lipitor for several weeks. Lipids in primary care last month.   Primary Care Physician: [name], MD  Past Medical History:  Diagnosis Date   Asthma    asthmatic symptoms, but "I don't have asthma"   CAD in native artery    a. NSTEMI 02/2016 with occ mLAD & occ small PDA both with collaterals, treated medically. b. Abn nuc 2018 -> stable cath, also 70% 1st septal -> med rx. EF 45-50%.   Cancer ([place])    skin cancer removed    COPD (chronic obstructive pulmonary disease) ([place])    Hyperlipidemia    Ischemic cardiomyopathy    a. 02/2016: EF 45-50% by cath, 40-45% by echo. b. Cath 2018: EF 45-50%.   Pancreatic lesion     Past Surgical History:  Procedure Laterality Date   CARDIAC CATHETERIZATION N/A 03/07/2016   Procedure: Left Heart Cath and Coronary Angiography;  Surgeon: [name], MD;  Location: [place] [facility];  Service: Cardiovascular;  Laterality: N/A;   knee surgery     left knee reconstruction   LEFT HEART CATH AND CORONARY ANGIOGRAPHY N/A 06/09/2017   Procedure: LEFT HEART CATH AND CORONARY ANGIOGRAPHY;  Surgeon: [name], MD;  Location: [place] [facility];  Service: Cardiovascular;  Laterality: N/A;   SEPTOPLASTY N/A 11/29/2017   Procedure: SEPTOPLASTY;  Surgeon: [name], MD;  Location: [place];  Service: ENT;  Laterality: N/A;   SINUS ENDO WITH FUSION N/A 11/29/2017   Procedure: ENDOSCOPIC SINUS  SURGERY WITH FUSION;  Surgeon: [name], MD;  Location: [place];  Service: ENT;  Laterality: N/A;   skin cancer removed     back of left neck    Current Outpatient Medications  Medication Sig Dispense Refill   acetaminophen (TYLENOL) 500 MG tablet Take 1,000 mg by mouth daily as needed for moderate pain or headache.     ADVAIR DISKUS 250-50 MCG/DOSE AEPB Inhale 1 puff into the lungs every 12 (twelve) hours.     albuterol (PROVENTIL HFA;VENTOLIN HFA) 108 (90 Base) MCG/ACT inhaler Inhale 1-2  puffs into the lungs every 6 (six) hours as needed for wheezing or shortness of breath.     albuterol (PROVENTIL) (2.5 MG/3ML) 0.083% nebulizer solution Take 2.5 mg by nebulization every 6 (six) hours as needed for wheezing or shortness of breath.     carbidopa-levodopa (SINEMET IR) 25-100 MG tablet Take 1 tablet by mouth in the morning, at noon, and at bedtime.     clopidogrel (PLAVIX) 75 MG tablet TAKE 1 TABLET BY MOUTH DAILY 30 tablet 10   fexofenadine (ALLEGRA) 180 MG tablet Take 1 tablet by mouth daily.     lipase/protease/amylase (CREON) 36000 UNITS CPEP capsule Take 1-2 capsules by mouth 3 (three) times daily with meals. Take 2 capsules by mouth with each meal and take 1 capsule with each snack     metoprolol succinate (TOPROL-XL) 25 MG 24 hr tablet Take 25 mg by mouth daily.     tamsulosin (FLOMAX) 0.4 MG CAPS capsule Take 1 capsule by mouth daily.     Testosterone 10 MG/ACT (2%) GEL Place 40 mg onto the skin every evening. 4 pumps     atorvastatin (LIPITOR) 80 MG tablet Take 1 tablet (80 mg total) by mouth daily at 6 PM. 90 tablet 3   gabapentin (NEURONTIN) 100 MG capsule Take 100 mg by mouth daily. (Patient not taking: Reported on 03/14/2022)     No current facility-administered medications for this visit.    Allergies  Allergen Reactions   Aspirin Shortness Of Breath   Nsaids Shortness Of Breath   Tolmetin Shortness Of Breath   Isosorbide Dinitrate     Severe migraine headache   Isosorbide Mononitrate [Isosorbide Dinitrate Er] Other (See Comments)    Severe migraine headache   Nitroglycerin Other (See Comments)    Pt reported BP dropped very low when he took oral NTG   Lisinopril Cough    Social History   Socioeconomic History   Marital status: Married    Spouse name: Not on file   Number of children: Not on file   Years of education: Not on file   Highest education level: Not on file  Occupational History   Occupation: Retired Nature conservation officer and [employer]  Tobacco Use   Smoking  status: Never   Smokeless tobacco: Never  Vaping Use   Vaping Use: Never used  Substance and Sexual Activity   Alcohol use: No   Drug use: No   Sexual activity: Not on file  Other Topics Concern   Not on file  Social History Narrative   Lives in [place] with wife   Social Determinants of Health   Financial Resource Strain: Not on file  Food Insecurity: Not on file  Transportation Needs: Not on file  Physical Activity: Not on file  Stress: Not on file  Social Connections: Not on file  Intimate Partner Violence: Not on file    Family History  Problem Relation Age of Onset   Cancer Father  Review of Systems:  As stated in the HPI and otherwise negative.   BP 138/78   Pulse 63   Ht '5\' 9"'$  (1.753 m)   Wt 203 lb 6.4 oz (92.3 kg)   SpO2 97%   BMI 30.04 kg/m   Physical Examination:  General: Well developed, well nourished, NAD  HEENT: OP clear, mucus membranes moist  SKIN: warm, dry. No rashes. Neuro: No focal deficits  Musculoskeletal: Muscle strength 5/5 all ext  Psychiatric: Mood and affect normal  Neck: No JVD, no carotid bruits, no thyromegaly, no lymphadenopathy.  Lungs:Clear bilaterally, no wheezes, rhonci, crackles Cardiovascular: Regular rate and rhythm. No murmurs, gallops or rubs. Abdomen:Soft. Bowel sounds present. Non-tender.  Extremities: No lower extremity edema. Pulses are 2 + in the bilateral DP/PT.  Cardiac cath 06/09/17: Ost RPDA lesion, 100 %stenosed. Mid LAD lesion, 100 %stenosed. Ost 1st Sept to 1st Sept lesion, 70 %stenosed. The left ventricular ejection fraction is 45-50% by visual estimate. There is mild left ventricular systolic dysfunction. LV end diastolic pressure is normal. There is no mitral valve regurgitation.   1. Chronic occlusion of the mid LAD just after a large diagonal branch. The mid and distal LAD fills from right to left collaterals and from left to left collaterals.  2. 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 Mild LV  systolic dysfunction with apical hypokinesis.  4. Normal LV filling pressures  Echo March 2018: - Left ventricle: The cavity size was normal. Wall thickness was   increased in a pattern of moderate LVH. Systolic function was   normal. The estimated ejection fraction was in the range of 50%   to 55%. Mild anteroseptal hypokinesis. Doppler parameters are   consistent with abnormal left ventricular relaxation (grade 1   diastolic dysfunction). The E/e&' ratio is between 8-15,   suggesting indeterminate LV filling pressure. - Mitral valve: Calcified annulus. Mildly thickened leaflets .   There was trivial regurgitation. - Left atrium: The atrium was normal in size. - Right atrium: The atrium was mildly dilated. - Inferior vena cava: The vessel was normal in size. The   respirophasic diameter changes were in the normal range (>= 50%),   consistent with normal central venous pressure.   Impressions:   - Compared to a prior study in 02/2016, the LVEF has improved to   50-55% (although there is still mild anteroseptal hypokinesis).  EKG:  EKG is ordered today. The ekg ordered today demonstrates NSR  Recent Labs: 03/25/2021: ALT 28; BUN 23; Creat 0.97; Hemoglobin 13.9; Platelets 220; Potassium 4.2; Sodium 141; TSH 2.59   Lipid Panel    Component Value Date/Time   CHOL 138 03/25/2021 0327   CHOL 139 02/05/2021 1020   TRIG 71 03/25/2021 0327   HDL 38 (L) 03/25/2021 0327   HDL 39 (L) 02/05/2021 1020   CHOLHDL 3.6 03/25/2021 0327   VLDL 28 04/29/2016 1004   LDLCALC 84 03/25/2021 0327     Wt Readings from Last 3 Encounters:  03/14/22 203 lb 6.4 oz (92.3 kg)  01/21/21 202 lb 12.8 oz (92 kg)  01/27/20 198 lb 6.4 oz (90 kg)     Other studies Reviewed: Additional studies/ records that were reviewed today include: . Review of the above records demonstrates:   Assessment and Plan:   1. CAD without angina: He is known to have chronic occlusion of the mid LAD and right PDA.  He has mild  LV dysfunction due to his chronic LAD occlusion. Last LVEF=45-50% by cath  in August 2018. He has no chest pain suggestive of angina. Will continue Plavix, beta blocker and statin. No ASA due to intolerance in past.     2. Ischemic cardiomyopathy: Echo March 2018 with LVEF of 50-55%. LVEF=45-50% by LV gram in August 2018. Continue Toprol. He stopped his Losartan due to low blood pressure.    3. Hyperlipidemia: LDL near goal in June 2022. This was checked again in April 2023 but no results available. Will try to get results from his primary care. Continue statin.  Current medicines are reviewed at length with the patient today.  The patient does not have concerns regarding medicines.  The following changes have been made:  no change  Labs/ tests ordered today include:   Orders Placed This Encounter  Procedures   EKG 12-Lead    Disposition:   F/U with me in 12  months  Signed, [name], MD 03/14/2022 11:05 AM    [facility] [place], [place], [place]  [address] Phone: [phone]; Fax: [phone]

## 2022-03-14 NOTE — Patient Instructions (Addendum)
Medication Instructions:  No changes *If you need a refill on your cardiac medications before your next appointment, please call your pharmacy*   Lab Work: none If you have labs (blood work) drawn today and your tests are completely normal, you will receive your results only by: [name] (if you have MyChart) OR A paper copy in the mail If you have any lab test that is abnormal or we need to change your treatment, we will call you to review the results.   Testing/Procedures: none   Follow-Up: At [facility], you and your health needs are our priority.  As part of our continuing mission to provide you with exceptional heart care, we have created designated Provider Care Teams.  These Care Teams include your primary Cardiologist (physician) and Advanced Practice Providers (APPs -  Physician Assistants and Nurse Practitioners) who all work together to provide you with the care you need, when you need it.  We recommend signing up for the patient portal called "MyChart".  Sign up information is provided on this After Visit Summary.  MyChart is used to connect with patients for Virtual Visits (Telemedicine).  Patients are able to view lab/test results, encounter notes, upcoming appointments, etc.  Non-urgent messages can be sent to your provider as well.   To learn more about what you can do with MyChart, go to [email or web address].    Your next appointment:   12 month(s)  The format for your next appointment:   In Person  Provider:   [name], MD     Called to PCP office, left VM on med rec requesting recent labs be faxed to Dr. Camillia Herter office [phone]   Important Information About Sugar

## 2022-03-28 ENCOUNTER — Other Ambulatory Visit: Payer: Self-pay

## 2022-03-28 MED ORDER — ATORVASTATIN CALCIUM 80 MG PO TABS: 80.0000 mg | ORAL_TABLET | Freq: Every day | ORAL | 3 refills | Status: DC

## 2022-05-31 ENCOUNTER — Other Ambulatory Visit: Payer: Self-pay | Admitting: Internal Medicine

## 2022-06-01 LAB — URINE CULTURE
MICRO NUMBER:: 13750619
SPECIMEN QUALITY:: ADEQUATE

## 2022-06-02 ENCOUNTER — Telehealth: Payer: Self-pay

## 2022-06-02 NOTE — Telephone Encounter (Signed)
Its ok to fill as Brand Lipitor if that is what she can tolerate.

## 2022-06-02 NOTE — Telephone Encounter (Signed)
[facility] mail order pharmacy stating that pt indicated the need for brand Lipitor due to heart racing and sob with generic medication. Please clarify if Dr. Angelena Form would prefer the brand or generic medication. Inv# [id number]  Ph# [phone] Please address

## 2022-06-15 MED ORDER — LIPITOR 80 MG PO TABS: 80.0000 mg | ORAL_TABLET | Freq: Every day | ORAL | 2 refills | Status: DC

## 2022-06-15 NOTE — Telephone Encounter (Signed)
Pt's medication was reorder as name brand, as requested by pt. Confirmation received.

## 2022-06-16 ENCOUNTER — Other Ambulatory Visit: Payer: Self-pay | Admitting: *Deleted

## 2022-06-16 MED ORDER — LIPITOR 40 MG PO TABS: 80.0000 mg | ORAL_TABLET | Freq: Every day | ORAL | 3 refills | Status: DC

## 2022-06-20 ENCOUNTER — Other Ambulatory Visit: Payer: Self-pay

## 2022-06-20 MED ORDER — LIPITOR 80 MG PO TABS: 80.0000 mg | ORAL_TABLET | Freq: Every day | ORAL | 3 refills | Status: DC

## 2022-12-01 ENCOUNTER — Other Ambulatory Visit: Payer: Self-pay | Admitting: Internal Medicine

## 2022-12-02 LAB — COMPLETE METABOLIC PANEL WITH GFR
AG Ratio: 1.4 (calc) (ref 1.0–2.5)
ALT: 10 U/L (ref 9–46)
AST: 20 U/L (ref 10–35)
Albumin: 4.2 g/dL (ref 3.6–5.1)
Alkaline phosphatase (APISO): 52 U/L (ref 35–144)
BUN: 18 mg/dL (ref 7–25)
CO2: 25 mmol/L (ref 20–32)
Calcium: 9.2 mg/dL (ref 8.6–10.3)
Chloride: 103 mmol/L (ref 98–110)
Creat: 0.93 mg/dL (ref 0.70–1.22)
Globulin: 2.9 g/dL (calc) (ref 1.9–3.7)
Glucose, Bld: 129 mg/dL — ABNORMAL HIGH (ref 65–99)
Potassium: 4.2 mmol/L (ref 3.5–5.3)
Sodium: 140 mmol/L (ref 135–146)
Total Bilirubin: 0.8 mg/dL (ref 0.2–1.2)
Total Protein: 7.1 g/dL (ref 6.1–8.1)
eGFR: 82 mL/min/{1.73_m2} (ref >=60)

## 2022-12-02 LAB — LIPID PANEL
Cholesterol: 134 mg/dL (ref <200)
HDL: 46 mg/dL (ref >=40)
LDL Cholesterol (Calc): 71 mg/dL (calc)
Non-HDL Cholesterol (Calc): 88 mg/dL (calc) (ref <130)
Total CHOL/HDL Ratio: 2.9 (calc) (ref <5.0)
Triglycerides: 88 mg/dL (ref <150)

## 2022-12-02 LAB — CBC
HCT: 42.9 % (ref 38.5–50.0)
Hemoglobin: 14.8 g/dL (ref 13.2–17.1)
MCH: 30.3 pg (ref 27.0–33.0)
MCHC: 34.5 g/dL (ref 32.0–36.0)
MCV: 87.7 fL (ref 80.0–100.0)
MPV: 11.9 fL (ref 7.5–12.5)
Platelets: 214 10*3/uL (ref 140–400)
RBC: 4.89 10*6/uL (ref 4.20–5.80)
RDW: 13.8 % (ref 11.0–15.0)
WBC: 6.8 10*3/uL (ref 3.8–10.8)

## 2022-12-02 LAB — TSH: TSH: 4.79 mIU/L — ABNORMAL HIGH (ref 0.40–4.50)

## 2023-03-27 NOTE — Progress Notes (Unsigned)
No chief complaint on file.  History of Present Illness: 82 yo male with history of COPD, asthma, hyperlipidemia, CAD, ischemic cardiomyopathy, Parkinson's disease and a pancreatic mass (benign) who is here today for follow up. He was admitted to [facility] 03/07/16 with a NSTEMI and underwent cardiac cath which showed 100% occlusion of the mid LAD and 100% occlusion of the small PDA, both vessels filling from collaterals. His LAD occlusion was felt to be more than 12 hours from presentation so the plan was for medical management. LVEF=40-45% by echo. He was not started on ASA due to intolerance.  Repeat echo March 2018 with LVEF=50-55%. 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 Surgery planned in the fall of 2018 but delayed due to his heart disease. Stress testing was considered mandatory prior to his surgery by anesthesia at [facility] given his extensive CAD. As expected, his nuclear stress test was abnormal. His nuclear stress test showed a reversible defect in the mid anteroseptal/mid inferoseptal and true apical defect, possibly due to ischemia. He was having no chest pain when I saw him in the office 06/11/17 but given his stress test findings, I repeated his cardiac cath on 06/09/17. This showed continued occlusion of the mid LAD and ostial right PDA. No options for PCI. He was having his Whipple procedure performed March 2019 but he developed hypotension during induction of anesthesia requiring pressors and the surgery was not started. They have elected not to perform the Whipple procedure. He has been told his tumor is benign. He has had no follow up for the mass by his choice. He was diagnosed with Parkinson's disease in February 2022. Losartan stopped in 2023 due to hypotension.   He is here today for follow up. The patient denies any chest pain, dyspnea, palpitations, lower extremity edema, orthopnea, PND, dizziness, near syncope or  syncope.   Primary Care Physician: Fleet Contras, MD  Past Medical History:  Diagnosis Date   Asthma    asthmatic symptoms, but "I don't have asthma"   CAD in native artery    a. NSTEMI 02/2016 with occ mLAD & occ small PDA both with collaterals, treated medically. b. Abn nuc 2018 -> stable cath, also 70% 1st septal -> med rx. EF 45-50%.   Cancer (HCC)    skin cancer removed    COPD (chronic obstructive pulmonary disease) (HCC)    Hyperlipidemia    Ischemic cardiomyopathy    a. 02/2016: EF 45-50% by cath, 40-45% by echo. b. Cath 2018: EF 45-50%.   Pancreatic lesion     Past Surgical History:  Procedure Laterality Date   CARDIAC CATHETERIZATION N/A 03/07/2016   Procedure: Left Heart Cath and Coronary Angiography;  Surgeon: Peter M Swaziland, MD;  Location: [facility] INVASIVE CV LAB;  Service: Cardiovascular;  Laterality: N/A;   knee surgery     left knee reconstruction   LEFT HEART CATH AND CORONARY ANGIOGRAPHY N/A 06/09/2017   Procedure: LEFT HEART CATH AND CORONARY ANGIOGRAPHY;  Surgeon: Kathleene Hazel, MD;  Location: [facility] INVASIVE CV LAB;  Service: Cardiovascular;  Laterality: N/A;   SEPTOPLASTY N/A 11/29/2017   Procedure: SEPTOPLASTY;  Surgeon: Christia Reading, MD;  Location: [facility] OR;  Service: ENT;  Laterality: N/A;   SINUS ENDO WITH FUSION N/A 11/29/2017   Procedure: ENDOSCOPIC SINUS SURGERY WITH FUSION;  Surgeon: Christia Reading, MD;  Location: [facility] OR;  Service: ENT;  Laterality: N/A;   skin cancer removed  back of left neck    Current Outpatient Medications  Medication Sig Dispense Refill   acetaminophen (TYLENOL) 500 MG tablet Take 1,000 mg by mouth daily as needed for moderate pain or headache.     ADVAIR DISKUS 250-50 MCG/DOSE AEPB Inhale 1 puff into the lungs every 12 (twelve) hours.     albuterol (PROVENTIL HFA;VENTOLIN HFA) 108 (90 Base) MCG/ACT inhaler Inhale 1-2 puffs into the lungs every 6 (six) hours as needed for wheezing or shortness of breath.     albuterol (PROVENTIL)  (2.5 MG/3ML) 0.083% nebulizer solution Take 2.5 mg by nebulization every 6 (six) hours as needed for wheezing or shortness of breath.     carbidopa-levodopa (SINEMET IR) 25-100 MG tablet Take 1 tablet by mouth in the morning, at noon, and at bedtime.     clopidogrel (PLAVIX) 75 MG tablet TAKE 1 TABLET BY MOUTH DAILY 30 tablet 10   fexofenadine (ALLEGRA) 180 MG tablet Take 1 tablet by mouth daily.     gabapentin (NEURONTIN) 100 MG capsule Take 100 mg by mouth daily. (Patient not taking: Reported on 03/14/2022)     lipase/protease/amylase (CREON) 36000 UNITS CPEP capsule Take 1-2 capsules by mouth 3 (three) times daily with meals. Take 2 capsules by mouth with each meal and take 1 capsule with each snack     LIPITOR 80 MG tablet Take 1 tablet (80 mg total) by mouth daily. 90 tablet 3   metoprolol succinate (TOPROL-XL) 25 MG 24 hr tablet Take 25 mg by mouth daily.     tamsulosin (FLOMAX) 0.4 MG CAPS capsule Take 1 capsule by mouth daily.     Testosterone 10 MG/ACT (2%) GEL Place 40 mg onto the skin every evening. 4 pumps     No current facility-administered medications for this visit.    Allergies  Allergen Reactions   Aspirin Shortness Of Breath   Nsaids Shortness Of Breath   Tolmetin Shortness Of Breath   Isosorbide Dinitrate     Severe migraine headache   Isosorbide Mononitrate [Isosorbide Dinitrate Er] Other (See Comments)    Severe migraine headache   Nitroglycerin Other (See Comments)    Pt reported BP dropped very low when he took oral NTG   Lisinopril Cough    Social History   Socioeconomic History   Marital status: Married    Spouse name: Not on file   Number of children: Not on file   Years of education: Not on file   Highest education level: Not on file  Occupational History   Occupation: Retired Hotel manager and [employer]  Tobacco Use   Smoking status: Never   Smokeless tobacco: Never  Vaping Use   Vaping Use: Never used  Substance and Sexual Activity   Alcohol use: No    Drug use: No   Sexual activity: Not on file  Other Topics Concern   Not on file  Social History Narrative   Lives in [place] with wife   Social Determinants of Health   Financial Resource Strain: Not on file  Food Insecurity: Not on file  Transportation Needs: Not on file  Physical Activity: Not on file  Stress: Not on file  Social Connections: Not on file  Intimate Partner Violence: Not on file    Family History  Problem Relation Age of Onset   Cancer Father     Review of Systems:  As stated in the HPI and otherwise negative.   There were no vitals taken for this visit.  Physical Examination:  General:  Well developed, well nourished, NAD  HEENT: OP clear, mucus membranes moist  SKIN: warm, dry. No rashes. Neuro: No focal deficits  Musculoskeletal: Muscle strength 5/5 all ext  Psychiatric: Mood and affect normal  Neck: No JVD, no carotid bruits, no thyromegaly, no lymphadenopathy.  Lungs:Clear bilaterally, no wheezes, rhonci, crackles Cardiovascular: Regular rate and rhythm. No murmurs, gallops or rubs. Abdomen:Soft. Bowel sounds present. Non-tender.  Extremities: No lower extremity edema. Pulses are 2 + in the bilateral DP/PT.  Cardiac cath 06/09/17: Ost RPDA lesion, 100 %stenosed. Mid LAD lesion, 100 %stenosed. Ost 1st Sept to 1st Sept lesion, 70 %stenosed. The left ventricular ejection fraction is 45-50% by visual estimate. There is mild left ventricular systolic dysfunction. LV end diastolic pressure is normal. There is no mitral valve regurgitation.   1. Chronic occlusion of the mid LAD just after a large diagonal branch. The mid and distal LAD fills from right to left collaterals and from left to left collaterals.  2. 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 Mild LV systolic dysfunction with apical hypokinesis.  4. Normal LV filling pressures  Echo March 2018: - Left ventricle: The cavity size was normal. Wall thickness was   increased in a pattern of  moderate LVH. Systolic function was   normal. The estimated ejection fraction was in the range of 50%   to 55%. Mild anteroseptal hypokinesis. Doppler parameters are   consistent with abnormal left ventricular relaxation (grade 1   diastolic dysfunction). The E/e&' ratio is between 8-15,   suggesting indeterminate LV filling pressure. - Mitral valve: Calcified annulus. Mildly thickened leaflets .   There was trivial regurgitation. - Left atrium: The atrium was normal in size. - Right atrium: The atrium was mildly dilated. - Inferior vena cava: The vessel was normal in size. The   respirophasic diameter changes were in the normal range (>= 50%),   consistent with normal central venous pressure.   Impressions:   - Compared to a prior study in 02/2016, the LVEF has improved to   50-55% (although there is still mild anteroseptal hypokinesis).  EKG:  EKG is *** ordered today. The ekg ordered today demonstrates   Recent Labs: 12/01/2022: ALT 10; BUN 18; Creat 0.93; Hemoglobin 14.8; Platelets 214; Potassium 4.2; Sodium 140; TSH 4.79   Lipid Panel    Component Value Date/Time   CHOL 134 12/01/2022 0000   CHOL 139 02/05/2021 1020   TRIG 88 12/01/2022 0000   HDL 46 12/01/2022 0000   HDL 39 (L) 02/05/2021 1020   CHOLHDL 2.9 12/01/2022 0000   VLDL 28 04/29/2016 1004   LDLCALC 71 12/01/2022 0000     Wt Readings from Last 3 Encounters:  03/14/22 92.3 kg  01/21/21 92 kg  01/27/20 90 kg     Assessment and Plan:   1. CAD without angina: He is known to have chronic occlusion of the mid LAD and right PDA.  He has mild LV dysfunction due to his chronic LAD occlusion. Last LVEF=45-50% by cath in August 2018. No chest pain. Continue Plavix, statin and beta blocker. No ASA due to intolerance in past.     2. Ischemic cardiomyopathy: Echo March 2018 with LVEF of 50-55%. LVEF=45-50% by LV gram in August 2018. Will continue Toprol. Losartan stopped in 2023 due to low blood pressure.    3.  Hyperlipidemia: LDL near goal in February 0981. Continue statin.   Labs/ tests ordered today include:  No orders of the defined types were placed in this  encounter.  Disposition:   F/U with me in 12  months  Signed, Verne Carrow, MD 03/27/2023 8:32 AM    [facility] [address] [place], [place], [place]  [address] Phone: [phone]; Fax: [phone]

## 2023-03-28 ENCOUNTER — Ambulatory Visit: Payer: Medicare Other | Attending: Cardiovascular Disease | Admitting: Cardiovascular Disease

## 2023-03-28 ENCOUNTER — Encounter: Payer: Self-pay | Admitting: Cardiovascular Disease

## 2023-03-28 VITALS — BP 130/90 | HR 54 | Ht 69.0 in | Wt 208.4 lb

## 2023-03-28 DIAGNOSIS — I255 Ischemic cardiomyopathy: Secondary | ICD-10-CM | POA: Diagnosis present

## 2023-03-28 DIAGNOSIS — I2510 Atherosclerotic heart disease of native coronary artery without angina pectoris: Secondary | ICD-10-CM | POA: Diagnosis present

## 2023-03-28 DIAGNOSIS — E785 Hyperlipidemia, unspecified: Secondary | ICD-10-CM | POA: Insufficient documentation

## 2023-03-28 MED ORDER — LIPITOR 80 MG PO TABS: 80.0000 mg | ORAL_TABLET | Freq: Every day | ORAL | 3 refills | Status: DC

## 2023-03-28 NOTE — Patient Instructions (Signed)
Medication Instructions:  No changes *If you need a refill on your cardiac medications before your next appointment, please call your pharmacy*   Lab Work: none   Testing/Procedures: none   Follow-Up: At [facility], you and your health needs are our priority.  As part of our continuing mission to provide you with exceptional heart care, we have created designated Provider Care Teams.  These Care Teams include your primary Cardiologist (physician) and Advanced Practice Providers (APPs -  Physician Assistants and Nurse Practitioners) who all work together to provide you with the care you need, when you need it.  We recommend signing up for the patient portal called "MyChart".  Sign up information is provided on this After Visit Summary.  MyChart is used to connect with patients for Virtual Visits (Telemedicine).  Patients are able to view lab/test results, encounter notes, upcoming appointments, etc.  Non-urgent messages can be sent to your provider as well.   To learn more about what you can do with MyChart, go to [email or web address].    Your next appointment:   12 month(s)  Provider:   Christopher McAlhany, MD      

## 2023-03-30 ENCOUNTER — Other Ambulatory Visit (HOSPITAL_COMMUNITY): Payer: Self-pay

## 2023-04-10 ENCOUNTER — Other Ambulatory Visit: Payer: Self-pay | Admitting: *Deleted

## 2023-04-10 MED ORDER — LIPITOR 80 MG PO TABS: 80.0000 mg | ORAL_TABLET | Freq: Every day | ORAL | 3 refills | Status: DC

## 2023-04-13 NOTE — Addendum Note (Signed)
Addended by: Dennis Bast F on: 04/13/2023 10:06 AM   Modules accepted: Orders

## 2024-05-29 NOTE — Progress Notes (Unsigned)
 No chief complaint on file.  History of Present Illness: 83 yo male with history of COPD, asthma, hyperlipidemia, CAD, ischemic cardiomyopathy, Parkinson's disease and a pancreatic mass (benign) who is here today for follow up. He was admitted to [facility] 03/07/16 with a NSTEMI and underwent cardiac cath which showed 100% occlusion of the mid LAD and 100% occlusion of the small PDA, both vessels filling from collaterals. His LAD occlusion was felt to be more than 12 hours from presentation so the plan was for medical management. LVEF=40-45% by echo. He was not started on ASA due to intolerance.  Repeat echo March 2018 with LVEF=50-55%. 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 Surgery planned in the fall of 2018 but delayed due to his heart disease. Stress testing was considered mandatory prior to his surgery by anesthesia at [facility] given his extensive CAD. As expected, his nuclear stress test was abnormal. His nuclear stress test showed a reversible defect in the mid anteroseptal/mid inferoseptal and true apical defect, possibly due to ischemia. He was having no chest pain when I saw him in the office 06/11/17 but given his stress test findings, I repeated his cardiac cath on 06/09/17. This showed continued occlusion of the mid LAD and ostial right PDA. No options for PCI. He was having his Whipple procedure performed March 2019 but he developed hypotension during induction of anesthesia requiring pressors and the surgery was not started. He has since been told that his tumor is benign. He has had no follow up for the mass by his choice. He was diagnosed with Parkinson's disease in February 2022. Losartan  stopped in 2023 due to hypotension.   He is here today for follow up. The patient denies any chest pain, dyspnea, palpitations, lower extremity edema, orthopnea, PND, dizziness, near syncope or syncope.   Primary Care Physician:  Shelda Atlas, MD  Past Medical History:  Diagnosis Date   Asthma    asthmatic symptoms, but I don't have asthma   CAD in native artery    a. NSTEMI 02/2016 with occ mLAD & occ small PDA both with collaterals, treated medically. b. Abn nuc 2018 -> stable cath, also 70% 1st septal -> med rx. EF 45-50%.   Cancer (HCC)    skin cancer removed    COPD (chronic obstructive pulmonary disease) (HCC)    Hyperlipidemia    Ischemic cardiomyopathy    a. 02/2016: EF 45-50% by cath, 40-45% by echo. b. Cath 2018: EF 45-50%.   Pancreatic lesion     Past Surgical History:  Procedure Laterality Date   CARDIAC CATHETERIZATION N/A 03/07/2016   Procedure: Left Heart Cath and Coronary Angiography;  Surgeon: Peter M Swaziland, MD;  Location: [facility] INVASIVE CV LAB;  Service: Cardiovascular;  Laterality: N/A;   knee surgery     left knee reconstruction   LEFT HEART CATH AND CORONARY ANGIOGRAPHY N/A 06/09/2017   Procedure: LEFT HEART CATH AND CORONARY ANGIOGRAPHY;  Surgeon: Verlin Lonni BIRCH, MD;  Location: [facility] INVASIVE CV LAB;  Service: Cardiovascular;  Laterality: N/A;   SEPTOPLASTY N/A 11/29/2017   Procedure: SEPTOPLASTY;  Surgeon: Carlie Clark, MD;  Location: [facility] OR;  Service: ENT;  Laterality: N/A;   SINUS ENDO WITH FUSION N/A 11/29/2017   Procedure: ENDOSCOPIC SINUS SURGERY WITH FUSION;  Surgeon: Carlie Clark, MD;  Location: [facility] OR;  Service: ENT;  Laterality: N/A;   skin cancer removed     back of left  neck    Current Outpatient Medications  Medication Sig Dispense Refill   acetaminophen  (TYLENOL ) 500 MG tablet Take 1,000 mg by mouth daily as needed for moderate pain or headache.     ADVAIR DISKUS 250-50 MCG/DOSE AEPB Inhale 1 puff into the lungs every 12 (twelve) hours.     albuterol  (PROVENTIL  HFA;VENTOLIN  HFA) 108 (90 Base) MCG/ACT inhaler Inhale 1-2 puffs into the lungs every 6 (six) hours as needed for wheezing or shortness of breath.     albuterol  (PROVENTIL ) (2.5 MG/3ML) 0.083% nebulizer solution  Take 2.5 mg by nebulization every 6 (six) hours as needed for wheezing or shortness of breath.     carbidopa-levodopa (SINEMET CR) 50-200 MG tablet Take 1 tablet by mouth at bedtime.     carbidopa-levodopa (SINEMET IR) 25-100 MG tablet Take 1 tablet by mouth in the morning, at noon, and at bedtime.     clopidogrel  (PLAVIX ) 75 MG tablet TAKE 1 TABLET BY MOUTH DAILY 30 tablet 10   fexofenadine (ALLEGRA) 180 MG tablet Take 1 tablet by mouth daily.     lipase/protease/amylase (CREON ) 36000 UNITS CPEP capsule Take 1-2 capsules by mouth 3 (three) times daily with meals. Take 2 capsules by mouth with each meal and take 1 capsule with each snack     LIPITOR  80 MG tablet Take 1 tablet (80 mg total) by mouth daily. 90 tablet 3   metoprolol  succinate (TOPROL -XL) 25 MG 24 hr tablet Take 25 mg by mouth daily.     tamsulosin (FLOMAX) 0.4 MG CAPS capsule Take 1 capsule by mouth daily.     Vitamin D, Ergocalciferol, (DRISDOL) 1.25 MG (50000 UNIT) CAPS capsule Take 50,000 Units by mouth every 7 (seven) days.     No current facility-administered medications for this visit.    Allergies  Allergen Reactions   Aspirin  Shortness Of Breath   Nsaids Shortness Of Breath   Tolmetin Shortness Of Breath   Isosorbide  Dinitrate     Severe migraine headache   Isosorbide  Mononitrate [Isosorbide  Dinitrate Er] Other (See Comments)    Severe migraine headache   Nitroglycerin  Other (See Comments)    Pt reported BP dropped very low when he took oral NTG   Lisinopril  Cough    Social History   Socioeconomic History   Marital status: Married    Spouse name: Not on file   Number of children: Not on file   Years of education: Not on file   Highest education level: Not on file  Occupational History   Occupation: Retired Hotel manager and [employer]  Tobacco Use   Smoking status: Never   Smokeless tobacco: Never  Vaping Use   Vaping status: Never Used  Substance and Sexual Activity   Alcohol use: No   Drug use: No   Sexual  activity: Not on file  Other Topics Concern   Not on file  Social History Narrative   Lives in [place] with wife   Social Drivers of Corporate investment banker Strain: Not on file  Food Insecurity: Not on file  Transportation Needs: Not on file  Physical Activity: Not on file  Stress: Not on file  Social Connections: Unknown (03/07/2022)   Received [facility]   Social Network    Social Network: Not on file  Intimate Partner Violence: Unknown (01/27/2022)   Received from [facility]   HITS    Physically Hurt: Not on file    Insult or Talk Down To: Not on file    Threaten Physical  Harm: Not on file    Scream or Curse: Not on file    Family History  Problem Relation Age of Onset   Cancer Father     Review of Systems:  As stated in the HPI and otherwise negative.   There were no vitals taken for this visit.  Physical Examination: General: Well developed, well nourished, NAD  HEENT: OP clear, mucus membranes moist  SKIN: warm, dry. No rashes. Neuro: No focal deficits  Musculoskeletal: Muscle strength 5/5 all ext  Psychiatric: Mood and affect normal  Neck: No JVD, no carotid bruits, no thyromegaly, no lymphadenopathy.  Lungs:Clear bilaterally, no wheezes, rhonci, crackles Cardiovascular: Regular rate and rhythm. No murmurs, gallops or rubs. Abdomen:Soft. Bowel sounds present. Non-tender.  Extremities: No lower extremity edema. Pulses are 2 + in the bilateral DP/PT.  EKG:  EKG is *** ordered today. The ekg ordered today demonstrates   Recent Labs: No results found for requested labs within last 365 days.   Lipid Panel    Component Value Date/Time   CHOL 134 12/01/2022 0000   CHOL 139 02/05/2021 1020   TRIG 88 12/01/2022 0000   HDL 46 12/01/2022 0000   HDL 39 (L) 02/05/2021 1020   CHOLHDL 2.9 12/01/2022 0000   VLDL 28 04/29/2016 1004   LDLCALC 71 12/01/2022 0000     Wt Readings from Last 3 Encounters:  03/28/23 208 lb 6.4 oz (94.5 kg)  03/14/22 203 lb  6.4 oz (92.3 kg)  01/21/21 202 lb 12.8 oz (92 kg)     Assessment and Plan:   1. CAD without angina: He is known to have chronic occlusion of the mid LAD and right PDA.  He has mild LV dysfunction due to his chronic LAD occlusion. Last LVEF=45-50% by cath in August 2018. No chest pain suggestive of angina. Continue Plavix , beta blocker and statin. No ASA due to intolerance in past.     2. Ischemic cardiomyopathy: Echo March 2018 with LVEF of 50-55%. LVEF=45-50% by LV gram in August 2018. Continue Toprol . Losartan  stopped in 2023 due to low blood pressure.    3. Hyperlipidemia: LDL ***. Continue statin.    Labs/ tests ordered today include:  No orders of the defined types were placed in this encounter.  Disposition:   F/U with me in 12  months  Signed, Lonni Cash, MD 05/29/2024 2:46 PM    [facility] [address] [place], [place], [place]  [address] Phone: [phone]; Fax: [phone]

## 2024-05-30 ENCOUNTER — Ambulatory Visit: Attending: Cardiovascular Disease | Admitting: Cardiovascular Disease

## 2024-05-30 ENCOUNTER — Encounter: Payer: Self-pay | Admitting: Cardiovascular Disease

## 2024-05-30 VITALS — BP 112/56 | Resp 16 | Ht 69.0 in

## 2024-05-30 DIAGNOSIS — E785 Hyperlipidemia, unspecified: Secondary | ICD-10-CM | POA: Diagnosis present

## 2024-05-30 DIAGNOSIS — I255 Ischemic cardiomyopathy: Secondary | ICD-10-CM | POA: Insufficient documentation

## 2024-05-30 DIAGNOSIS — I2510 Atherosclerotic heart disease of native coronary artery without angina pectoris: Secondary | ICD-10-CM | POA: Insufficient documentation

## 2024-05-30 MED ORDER — ATORVASTATIN CALCIUM 80 MG PO TABS: 80.0000 mg | ORAL_TABLET | Freq: Every day | ORAL | 3 refills | Status: AC

## 2024-05-30 NOTE — Patient Instructions (Signed)
 Medication Instructions:  No changes *If you need a refill on your cardiac medications before your next appointment, please call your pharmacy*  Lab Work: Today - first floor for blood work --lipids, liver function  If you have labs (blood work) drawn today and your tests are completely normal, you will receive your results only by: MyChart Message (if you have MyChart) OR A paper copy in the mail If you have any lab test that is abnormal or we need to change your treatment, we will call you to review the results.  Testing/Procedures: none  Follow-Up: At [facility], you and your health needs are our priority.  As part of our continuing mission to provide you with exceptional heart care, our providers are all part of one team.  This team includes your primary Cardiologist (physician) and Advanced Practice Providers or APPs (Physician Assistants and Nurse Practitioners) who all work together to provide you with the care you need, when you need it.  Your next appointment:   12 month(s)  Provider:   Lonni Cash, MD    We recommend signing up for the patient portal called MyChart.  Sign up information is provided on this After Visit Summary.  MyChart is used to connect with patients for Virtual Visits (Telemedicine).  Patients are able to view lab/test results, encounter notes, upcoming appointments, etc.  Non-urgent messages can be sent to your provider as well.   To learn more about what you can do with MyChart, go to [email or web address].

## 2024-05-31 ENCOUNTER — Ambulatory Visit: Payer: Self-pay | Admitting: Cardiovascular Disease

## 2024-05-31 LAB — LIPID PANEL
Chol/HDL Ratio: 3.2 ratio (ref 0.0–5.0)
Cholesterol, Total: 123 mg/dL (ref 100–199)
HDL: 39 mg/dL — ABNORMAL LOW (ref >39)
LDL Chol Calc (NIH): 68 mg/dL (ref 0–99)
Triglycerides: 77 mg/dL (ref 0–149)
VLDL Cholesterol Cal: 16 mg/dL (ref 5–40)

## 2024-05-31 LAB — HEPATIC FUNCTION PANEL
ALT: 9 IU/L (ref 0–44)
AST: 22 IU/L (ref 0–40)
Albumin: 3.9 g/dL (ref 3.7–4.7)
Alkaline Phosphatase: 60 IU/L (ref 44–121)
Bilirubin Total: 0.7 mg/dL (ref 0.0–1.2)
Bilirubin, Direct: 0.26 mg/dL (ref 0.00–0.40)
Total Protein: 6.6 g/dL (ref 6.0–8.5)
